# Patient Record
Sex: Male | Born: 1960 | Race: Black or African American | Hispanic: No | Marital: Married | Smoking: Never smoker
Health system: Southern US, Community
[De-identification: ages and names within clinical notes are randomized; demographics above are authoritative.]

## PROBLEM LIST (undated history)

## (undated) DIAGNOSIS — D509 Iron deficiency anemia, unspecified: Principal | ICD-10-CM

## (undated) DIAGNOSIS — C259 Malignant neoplasm of pancreas, unspecified: Secondary | ICD-10-CM

## (undated) DIAGNOSIS — C229 Malignant neoplasm of liver, not specified as primary or secondary: Secondary | ICD-10-CM

## (undated) HISTORY — DX: Iron deficiency anemia, unspecified: D50.9

## (undated) HISTORY — PX: COLONOSCOPY: SHX174

## (undated) HISTORY — PX: UPPER GI ENDOSCOPY: SHX6162

---
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
## 1982-08-31 HISTORY — PX: ABDOMINAL ADHESION SURGERY: SHX90

## 2003-05-15 ENCOUNTER — Encounter: Payer: Self-pay | Admitting: Emergency Medicine

## 2003-05-15 ENCOUNTER — Emergency Department (HOSPITAL_COMMUNITY): Admission: EM | Admit: 2003-05-15 | Discharge: 2003-05-15 | Payer: Self-pay | Admitting: Emergency Medicine

## 2004-10-22 ENCOUNTER — Ambulatory Visit: Payer: Self-pay | Admitting: Hematology & Oncology

## 2004-12-18 ENCOUNTER — Ambulatory Visit: Payer: Self-pay | Admitting: Hematology & Oncology

## 2005-04-09 ENCOUNTER — Ambulatory Visit: Payer: Self-pay | Admitting: Hematology & Oncology

## 2005-05-25 ENCOUNTER — Ambulatory Visit: Payer: Self-pay | Admitting: Hematology & Oncology

## 2005-06-04 ENCOUNTER — Ambulatory Visit: Payer: Self-pay | Admitting: Hematology & Oncology

## 2005-12-02 ENCOUNTER — Ambulatory Visit: Payer: Self-pay | Admitting: Hematology & Oncology

## 2005-12-30 LAB — CBC & DIFF AND RETIC
Basophils Absolute: 0 10*3/uL (ref 0.0–0.1)
Eosinophils Absolute: 0.2 10*3/uL (ref 0.0–0.5)
HCT: 39.8 % (ref 38.7–49.9)
HGB: 13.4 g/dL (ref 13.0–17.1)
LYMPH%: 54.2 % — ABNORMAL HIGH (ref 14.0–48.0)
MCV: 80.8 fL — ABNORMAL LOW (ref 81.6–98.0)
MONO%: 7.5 % (ref 0.0–13.0)
NEUT#: 1.6 10*3/uL (ref 1.5–6.5)
Platelets: 222 10*3/uL (ref 145–400)
RDW: 14 % (ref 11.2–14.6)
RETIC #: 32 10*3/uL (ref 31.8–103.9)

## 2005-12-30 LAB — FERRITIN: Ferritin: 5 ng/mL — ABNORMAL LOW (ref 22–322)

## 2006-06-28 ENCOUNTER — Ambulatory Visit: Payer: Self-pay | Admitting: Hematology & Oncology

## 2006-06-30 LAB — CBC WITH DIFFERENTIAL/PLATELET
Basophils Absolute: 0 10*3/uL (ref 0.0–0.1)
Eosinophils Absolute: 0.1 10*3/uL (ref 0.0–0.5)
HCT: 42.9 % (ref 38.7–49.9)
LYMPH%: 38 % (ref 14.0–48.0)
MONO#: 0.3 10*3/uL (ref 0.1–0.9)
NEUT#: 2.9 10*3/uL (ref 1.5–6.5)
NEUT%: 53.4 % (ref 40.0–75.0)
Platelets: 247 10*3/uL (ref 145–400)
WBC: 5.4 10*3/uL (ref 4.0–10.0)

## 2007-02-08 ENCOUNTER — Ambulatory Visit: Payer: Self-pay | Admitting: Internal Medicine

## 2007-02-08 DIAGNOSIS — E164 Increased secretion of gastrin: Secondary | ICD-10-CM | POA: Insufficient documentation

## 2007-02-08 DIAGNOSIS — N401 Enlarged prostate with lower urinary tract symptoms: Secondary | ICD-10-CM | POA: Insufficient documentation

## 2007-02-09 ENCOUNTER — Encounter: Payer: Self-pay | Admitting: Internal Medicine

## 2007-02-10 LAB — CONVERTED CEMR LAB: Hgb A1c MFr Bld: 6.3 % — ABNORMAL HIGH (ref 4.6–6.0)

## 2007-06-27 ENCOUNTER — Ambulatory Visit: Payer: Self-pay | Admitting: Hematology & Oncology

## 2007-06-29 LAB — FERRITIN: Ferritin: 6 ng/mL — ABNORMAL LOW (ref 22–322)

## 2007-06-29 LAB — CBC WITH DIFFERENTIAL/PLATELET
Basophils Absolute: 0 10*3/uL (ref 0.0–0.1)
Eosinophils Absolute: 0.1 10*3/uL (ref 0.0–0.5)
HGB: 13.8 g/dL (ref 13.0–17.1)
MONO#: 0.3 10*3/uL (ref 0.1–0.9)
NEUT#: 2.1 10*3/uL (ref 1.5–6.5)
RBC: 5.08 10*6/uL (ref 4.20–5.71)
RDW: 13.4 % (ref 11.2–14.6)
WBC: 4.6 10*3/uL (ref 4.0–10.0)

## 2007-06-29 LAB — CHCC SMEAR

## 2007-07-18 ENCOUNTER — Ambulatory Visit: Payer: Self-pay | Admitting: Internal Medicine

## 2007-07-18 DIAGNOSIS — F528 Other sexual dysfunction not due to a substance or known physiological condition: Secondary | ICD-10-CM | POA: Insufficient documentation

## 2007-08-15 ENCOUNTER — Encounter: Payer: Self-pay | Admitting: Internal Medicine

## 2007-09-01 DIAGNOSIS — C259 Malignant neoplasm of pancreas, unspecified: Secondary | ICD-10-CM

## 2007-09-01 DIAGNOSIS — C229 Malignant neoplasm of liver, not specified as primary or secondary: Secondary | ICD-10-CM

## 2007-09-01 HISTORY — DX: Malignant neoplasm of pancreas, unspecified: C25.9

## 2007-09-01 HISTORY — PX: LIVER SURGERY: SHX698

## 2007-09-01 HISTORY — PX: PANCREAS SURGERY: SHX731

## 2007-09-01 HISTORY — DX: Malignant neoplasm of liver, not specified as primary or secondary: C22.9

## 2007-09-26 ENCOUNTER — Ambulatory Visit: Payer: Self-pay | Admitting: Internal Medicine

## 2007-09-26 ENCOUNTER — Encounter: Payer: Self-pay | Admitting: Emergency Medicine

## 2007-09-26 ENCOUNTER — Inpatient Hospital Stay (HOSPITAL_COMMUNITY): Admission: EM | Admit: 2007-09-26 | Discharge: 2007-09-27 | Payer: Self-pay | Admitting: Emergency Medicine

## 2007-09-29 ENCOUNTER — Ambulatory Visit: Payer: Self-pay | Admitting: Hematology & Oncology

## 2007-09-29 ENCOUNTER — Encounter: Payer: Self-pay | Admitting: Internal Medicine

## 2007-10-10 ENCOUNTER — Ambulatory Visit (HOSPITAL_COMMUNITY): Admission: RE | Admit: 2007-10-10 | Discharge: 2007-10-10 | Payer: Self-pay | Admitting: Hematology & Oncology

## 2007-10-10 LAB — CBC WITH DIFFERENTIAL/PLATELET
Basophils Absolute: 0 10*3/uL (ref 0.0–0.1)
EOS%: 1.8 % (ref 0.0–7.0)
Eosinophils Absolute: 0.1 10*3/uL (ref 0.0–0.5)
LYMPH%: 46.1 % (ref 14.0–48.0)
MCH: 23.7 pg — ABNORMAL LOW (ref 28.0–33.4)
MCV: 75.6 fL — ABNORMAL LOW (ref 81.6–98.0)
MONO%: 6.2 % (ref 0.0–13.0)
NEUT#: 2.3 10*3/uL (ref 1.5–6.5)
Platelets: 244 10*3/uL (ref 145–400)
RBC: 5.03 10*6/uL (ref 4.20–5.71)

## 2007-10-10 LAB — COMPREHENSIVE METABOLIC PANEL
AST: 26 U/L (ref 0–37)
Alkaline Phosphatase: 58 U/L (ref 39–117)
BUN: 10 mg/dL (ref 6–23)
Glucose, Bld: 96 mg/dL (ref 70–99)
Sodium: 141 mEq/L (ref 135–145)
Total Bilirubin: 0.5 mg/dL (ref 0.3–1.2)

## 2007-11-10 ENCOUNTER — Ambulatory Visit: Payer: Self-pay | Admitting: Hematology & Oncology

## 2008-06-29 ENCOUNTER — Ambulatory Visit: Payer: Self-pay | Admitting: Hematology & Oncology

## 2008-08-09 ENCOUNTER — Telehealth (INDEPENDENT_AMBULATORY_CARE_PROVIDER_SITE_OTHER): Payer: Self-pay | Admitting: *Deleted

## 2008-09-06 ENCOUNTER — Ambulatory Visit: Payer: Self-pay | Admitting: Internal Medicine

## 2008-09-06 DIAGNOSIS — D376 Neoplasm of uncertain behavior of liver, gallbladder and bile ducts: Secondary | ICD-10-CM

## 2008-09-06 DIAGNOSIS — D510 Vitamin B12 deficiency anemia due to intrinsic factor deficiency: Secondary | ICD-10-CM

## 2008-09-10 ENCOUNTER — Encounter (INDEPENDENT_AMBULATORY_CARE_PROVIDER_SITE_OTHER): Payer: Self-pay | Admitting: *Deleted

## 2009-05-08 ENCOUNTER — Ambulatory Visit: Payer: Self-pay | Admitting: Internal Medicine

## 2009-05-08 DIAGNOSIS — IMO0001 Reserved for inherently not codable concepts without codable children: Secondary | ICD-10-CM

## 2009-05-10 LAB — CONVERTED CEMR LAB
Basophils Absolute: 0 10*3/uL (ref 0.0–0.1)
Basophils Relative: 0.1 % (ref 0.0–3.0)
Eosinophils Absolute: 0.1 10*3/uL (ref 0.0–0.7)
HCT: 39.2 % (ref 39.0–52.0)
Hemoglobin: 12.7 g/dL — ABNORMAL LOW (ref 13.0–17.0)
Lymphs Abs: 1.6 10*3/uL (ref 0.7–4.0)
MCHC: 32.4 g/dL (ref 30.0–36.0)
Monocytes Relative: 7.8 % (ref 3.0–12.0)
Neutro Abs: 0.8 10*3/uL — ABNORMAL LOW (ref 1.4–7.7)
RBC: 5.32 M/uL (ref 4.22–5.81)
RDW: 17.7 % — ABNORMAL HIGH (ref 11.5–14.6)
Vitamin B-12: 313 pg/mL (ref 211–911)

## 2009-05-14 ENCOUNTER — Encounter (INDEPENDENT_AMBULATORY_CARE_PROVIDER_SITE_OTHER): Payer: Self-pay | Admitting: *Deleted

## 2009-05-14 ENCOUNTER — Telehealth (INDEPENDENT_AMBULATORY_CARE_PROVIDER_SITE_OTHER): Payer: Self-pay | Admitting: *Deleted

## 2009-06-04 ENCOUNTER — Ambulatory Visit: Payer: Self-pay | Admitting: Internal Medicine

## 2009-06-05 ENCOUNTER — Encounter (INDEPENDENT_AMBULATORY_CARE_PROVIDER_SITE_OTHER): Payer: Self-pay | Admitting: *Deleted

## 2009-06-05 LAB — CONVERTED CEMR LAB
Eosinophils Relative: 2.5 % (ref 0.0–5.0)
HCT: 40.7 % (ref 39.0–52.0)
Hemoglobin: 13.1 g/dL (ref 13.0–17.0)
Lymphs Abs: 2 10*3/uL (ref 0.7–4.0)
MCV: 75.2 fL — ABNORMAL LOW (ref 78.0–100.0)
Monocytes Absolute: 0.4 10*3/uL (ref 0.1–1.0)
Monocytes Relative: 8 % (ref 3.0–12.0)
Neutro Abs: 2 10*3/uL (ref 1.4–7.7)
Platelets: 197 10*3/uL (ref 150.0–400.0)
WBC: 4.5 10*3/uL (ref 4.5–10.5)

## 2009-07-05 ENCOUNTER — Ambulatory Visit: Payer: Self-pay | Admitting: Internal Medicine

## 2009-07-25 ENCOUNTER — Emergency Department (HOSPITAL_COMMUNITY): Admission: EM | Admit: 2009-07-25 | Discharge: 2009-07-25 | Payer: Self-pay | Admitting: Emergency Medicine

## 2009-08-01 ENCOUNTER — Ambulatory Visit: Payer: Self-pay | Admitting: Internal Medicine

## 2009-08-02 ENCOUNTER — Ambulatory Visit: Payer: Self-pay | Admitting: Internal Medicine

## 2009-08-02 DIAGNOSIS — M545 Low back pain: Secondary | ICD-10-CM

## 2009-08-05 ENCOUNTER — Encounter: Payer: Self-pay | Admitting: Internal Medicine

## 2009-09-02 ENCOUNTER — Ambulatory Visit: Payer: Self-pay | Admitting: Internal Medicine

## 2009-09-27 ENCOUNTER — Telehealth (INDEPENDENT_AMBULATORY_CARE_PROVIDER_SITE_OTHER): Payer: Self-pay | Admitting: *Deleted

## 2009-10-02 ENCOUNTER — Ambulatory Visit: Payer: Self-pay | Admitting: Internal Medicine

## 2009-10-30 ENCOUNTER — Ambulatory Visit: Payer: Self-pay | Admitting: Internal Medicine

## 2010-01-01 ENCOUNTER — Ambulatory Visit: Payer: Self-pay | Admitting: Internal Medicine

## 2010-02-24 ENCOUNTER — Encounter: Payer: Self-pay | Admitting: Internal Medicine

## 2010-09-02 ENCOUNTER — Telehealth (INDEPENDENT_AMBULATORY_CARE_PROVIDER_SITE_OTHER): Payer: Self-pay | Admitting: *Deleted

## 2010-09-15 ENCOUNTER — Telehealth (INDEPENDENT_AMBULATORY_CARE_PROVIDER_SITE_OTHER): Payer: Self-pay | Admitting: *Deleted

## 2010-09-28 LAB — CONVERTED CEMR LAB
ALT: 32 units/L (ref 0–53)
AST: 32 units/L (ref 0–37)
Alkaline Phosphatase: 63 units/L (ref 39–117)
Basophils Absolute: 0 10*3/uL (ref 0.0–0.1)
Bilirubin, Direct: 0.1 mg/dL (ref 0.0–0.3)
CO2: 30 meq/L (ref 19–32)
Chloride: 102 meq/L (ref 96–112)
Glucose, Bld: 87 mg/dL (ref 70–99)
Hemoglobin: 10.7 g/dL — ABNORMAL LOW (ref 13.0–17.0)
LDL Cholesterol: 117 mg/dL — ABNORMAL HIGH (ref 0–99)
Lymphocytes Relative: 52.3 % — ABNORMAL HIGH (ref 12.0–46.0)
Monocytes Relative: 10.7 % (ref 3.0–12.0)
Neutro Abs: 1.4 10*3/uL (ref 1.4–7.7)
Neutrophils Relative %: 33.5 % — ABNORMAL LOW (ref 43.0–77.0)
Platelets: 257 10*3/uL (ref 150–400)
Potassium: 4 meq/L (ref 3.5–5.1)
RDW: 15.6 % — ABNORMAL HIGH (ref 11.5–14.6)
Sodium: 139 meq/L (ref 135–145)
Total Bilirubin: 0.7 mg/dL (ref 0.3–1.2)
Total CHOL/HDL Ratio: 4.2
Total Protein: 7.3 g/dL (ref 6.0–8.3)
Triglycerides: 34 mg/dL (ref 0–149)
VLDL: 7 mg/dL (ref 0–40)

## 2010-09-30 NOTE — Assessment & Plan Note (Signed)
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)    Route: IM    Site: R deltoid    Exp Date: 07/2011    Lot #: [id number]    Mfr: American Regent    Patient tolerated injection without complications    Given by: Floydene Flock (October 30, 2009 1:27 PM)  Orders Added: 1)  Admin of Therapeutic Inj  intramuscular or subcutaneous [96372] 2)  Vit B12 1000 mcg [J3420]   Medication Administration  Injection # 1:    Medication: Vit B12 1000 mcg    Diagnosis: PERNICIOUS ANEMIA (ICD-281.0)    Route: IM    Site: R deltoid    Exp Date: 07/2011    Lot #: [id number]    Mfr: American Regent    Patient tolerated injection without complications    Given by: Floydene Flock (October 30, 2009 1:27 PM)  Orders Added: 1)  Admin of Therapeutic Inj  intramuscular or subcutaneous [96372] 2)  Vit B12 1000 mcg [J3420]

## 2010-09-30 NOTE — Assessment & Plan Note (Signed)
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.0)    Route: IM    Site: R deltoid    Exp Date: 06/2011    Lot #: [id number]    Mfr: American Regent    Patient tolerated injection without complications    Given by: Floydene Flock (October 02, 2009 1:11 PM)  Orders Added: 1)  Admin of Therapeutic Inj  intramuscular or subcutaneous [96372] 2)  Vit B12 1000 mcg [J3420]   Medication Administration  Injection # 1:    Medication: Vit B12 1000 mcg    Diagnosis: PERNICIOUS ANEMIA (ICD-281.0)    Route: IM    Site: R deltoid    Exp Date: 06/2011    Lot #: [id number]    Mfr: American Regent    Patient tolerated injection without complications    Given by: Floydene Flock (October 02, 2009 1:11 PM)  Orders Added: 1)  Admin of Therapeutic Inj  intramuscular or subcutaneous [96372] 2)  Vit B12 1000 mcg [J3420]

## 2010-09-30 NOTE — Consult Note (Signed)
Summary: [facility]  [facility]   Imported By: Lanelle Bal 03/12/2010 10:24:37  _____________________________________________________________________  External Attachment:    Type:   Image     Comment:   External Document

## 2010-09-30 NOTE — Progress Notes (Signed)
Summary: REFILL  Phone Note Refill Request Message from:  Fax from Pharmacy on [address] FAX [phone]  Refills Requested: Medication #1:  CIALIS 20 MG  TABS 1 q 3 days as needed Initial call taken by: Barb Merino,  September 27, 2009 2:35 PM    Prescriptions: CIALIS 20 MG  TABS (TADALAFIL) 1 q 3 days as needed  #6 x 5   Entered by:   Shonna Chock   Authorized by:   Marga Melnick MD   Signed by:   Shonna Chock on 09/27/2009   Method used:   Electronically to        [facility] [address] [phone]* (retail)       [address]       [place], [place]  [address]       Ph: [phone]       Fax: [phone]   RxID:   [id number]

## 2010-09-30 NOTE — Assessment & Plan Note (Signed)
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)    Route: IM    Site: R deltoid    Exp Date: 05/2011    Lot #: [id number]    Mfr: American Regent    Patient tolerated injection without complications    Given by: Floydene Flock (September 02, 2009 2:18 PM)  Orders Added: 1)  Admin of Therapeutic Inj  intramuscular or subcutaneous [96372] 2)  Vit B12 1000 mcg [J3420]   Medication Administration  Injection # 1:    Medication: Vit B12 1000 mcg    Diagnosis: PERNICIOUS ANEMIA (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

## 2010-10-02 NOTE — Progress Notes (Signed)
Summary: RX  Phone Note Call from Patient Call back at [phone]   Caller: Patient Summary of Call: PT IS CALLING FOR REFILL ON CIALIS 20 MG TO BE SEND TO [facility] ON [address]. Initial call taken by: Freddy Jaksch,  September 15, 2010 11:38 AM    Prescriptions: CIALIS 20 MG  TABS (TADALAFIL) 1 q 3 days as needed  #6 x 0   Entered by:   Shonna Chock CMA   Authorized by:   Marga Melnick MD   Signed by:   Shonna Chock CMA on 09/15/2010   Method used:   Electronically to        [facility] [phone]* (retail)       [address]       [place], [place]  [address]       Ph: [phone]       Fax: [phone]   RxID:   [id number]

## 2010-10-02 NOTE — Progress Notes (Signed)
Summary: RX  Phone Note Call from Patient Call back at Home Phone [phone]   Caller: Patient Call For: Marga Melnick MD Summary of Call: PT WAS A WALK IN AND WANTED SAMPLE OF CILIAS 20 MG NOW HE WOULD A SCRIPT FOR CILIAS 20 MG TO GO TO THE PHARM. Initial call taken by: Freddy Jaksch,  September 02, 2010 1:59 PM  Follow-up for Phone Call        1.) Patient not see x 1 year, please contact patient for an appointment (CPX-30 min appointment)   2.) Please get a pharmacy number for RX to be sent, no refills will be given due to last OV longer than 1 year Follow-up by: Shonna Chock CMA,  September 02, 2010 3:11 PM  Additional Follow-up for Phone Call Additional follow up Details #1::        left message on 1-3-1-4-1-5, HE  has not return a call. Additional Follow-up by: Freddy Jaksch,  September 04, 2010 10:10 AM    Additional Follow-up for Phone Call Additional follow up Details #2::    PT NEVER RETURN CALL FOR THIS SCRIPT NOR WITH A PHARMACY NAME Follow-up by: Freddy Jaksch,  September 05, 2010 11:54 AM

## 2011-01-09 ENCOUNTER — Telehealth: Payer: Self-pay | Admitting: *Deleted

## 2011-01-12 NOTE — Telephone Encounter (Signed)
Opened in error

## 2011-01-13 NOTE — H&P (Signed)
NAME:  Adam Huffman, Adam Huffman                ACCOUNT NO.:  [id number]   MEDICAL RECORD NO.:  [id number]          PATIENT TYPE:  EMS   LOCATION:  ED                           FACILITY:  [facility]   PHYSICIAN:  Sabino Donovan, MD        DATE OF BIRTH:  19-Apr-1961   DATE OF ADMISSION:  09/25/2007  DATE OF DISCHARGE:                              HISTORY & PHYSICAL   PRIMARY CARE PHYSICIAN:  Dr. [name].   CHIEF COMPLAINT:  Abdominal pain.   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  Reports that  after church, he ate raw broccoli and trail mix containing nuts.  Around  8:00 o'clock the night of presentation, he started having mid  epigastric, sharp, abdominal pain.  He also noticed some abdominal  distention as well and noted that he had a difficult time passing gas.  Actually he was not passing gas at all.  It kind of reminded him of his  previous abdominal pain related to his abdominal obstruction and he  presented to the ER.  Otherwise, he denied any fever or chills, nausea  or vomiting, diarrhea.  He reports that he had two bowel movements the  day of presentation but has not been passing gas or having bowel  movement after the pain started.   ALLERGIES:  NO KNOWN DRUG ALLERGIES.   MEDICATION:  Vitamin B12 shots every now and then.   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  Reportedly, it was metastasized to his liver as well and reports  initially it was thought to be malignant but then he has been doing well  for the last 20 years.   PAST SURGICAL HISTORY:  1. Total gastrectomy in 1981 in [place], [place], secondary      to ulcers.  2. He had another abdominal surgery  secondary to adhesions in 1984.      Of note, he has been followed with Dr. Myna Hidalgo, an oncologist at      [facility] for his cancer, although no further      work-up has been done since 1985.   FAMILY HISTORY:  Noncontributory.   SOCIAL HISTORY:  Negative x3.   REVIEW OF SYSTEMS:  Negative.   PHYSICAL EXAMINATION:  VITAL SIGNS:  Temperature 98.7, pulse 75,  respiratory rate 18, blood pressure 128/94.  GENERAL APPEARANCE:  He is in no acute distress.  HEENT:  PERRLA, EOMI.  NECK:  No lymphadenopathy.  No thyromegaly.  No JVD.  CHEST:  Clear to auscultation bilaterally.  CARDIOVASCULAR:  Regular rate and rhythm.  No murmurs, rubs, or gallops.  ABDOMEN:  Soft, nontender, nondistended, normal active bowel sounds.  EXTREMITIES:  No clubbing, cyanosis, or edema.   LABORATORY DATA:  Sodium 137, potassium 4.1, BUN 9, creatinine 1.04,  calcium  9.5.  White count 10.8, hemoglobin 13, hematocrit 39.3,  platelets  279.  AST 31, ALT 23, albumin 4.7, total protein 7.9, lipase  12, amylase 112.  UA was negative.   A chest x-ray showed small-bowel ileus/partial obstruction, mild colonic  ileus.   CT of his abdomen showed a 10 x 7 x 9 cm hydrogenous mass in the  inferior right lobe of the liver with calcification along the medial  border, leading diagnosis hepatocellular carcinoma, adenomas rare and  min, no other lesion to suggest mets.  Patient is status post subtotal  gastrectomy, intermediate cystic lesion in kidney.  He had small-bowel  ileus with mild dilation and no obstruction was noted.  Minimal  perihepatic free fluid.   IMPRESSION:  A 50 year old African American male with history of total  gastrectomy, status post another abdominal surgery secondary to adhesion  and history of questionable pancreatic cancer with questionable  metastases to the liver, who presented to the ED with complaints of  abdominal pain after eating broccoli and nuts.  1. Abdominal pain:   Given the clinical history of colicky abdominal      pain along with chest x-ray and CT finding of ileus, it is likely      pain related to his ileus.  Patient now reports that after      presenting to the ER, he has passed gas and now feels back to his      baseline.  His abdominal exam is certainly very benign.  I will      give IV fluids and follow.  We will obtain another KUB in a few      hours.  If KUB shows no ileus, he could potentially be discharged      home.  2. Liver mass:  Patient is noted to have this 10 x 9 x 6 cm liver mass      in his right lobe.  Apparently has a history which dates back to      1985 and he has been followed by Dr. Myna Hidalgo, oncologist, and alpha      fetoprotein given the leading diagnosis of hepatocellular      carcinoma.  He denies any history of heavy alcohol use or any      intravenous drug use.  This can be followed up as an outpatient.      Sabino Donovan, MD  Electronically Signed     MJ/MEDQ  D:  09/26/2007  T:  09/26/2007  Job:  [id number]

## 2011-01-13 NOTE — Discharge Summary (Signed)
NAME:  Adam Huffman, Adam Huffman                ACCOUNT NO.:  [id number]   MEDICAL RECORD NO.:  [id number]          PATIENT TYPE:  EMS   LOCATION:  ED                           FACILITY:  [facility]   PHYSICIAN:  Rosalyn Gess. Norins, MD  DATE OF BIRTH:  1961-04-16   DATE OF ADMISSION:  09/26/2007  DATE OF DISCHARGE:  09/27/2007                               DISCHARGE SUMMARY   ADMISSION DIAGNOSIS:  Nausea and vomiting, abdominal discomfort   DISCHARGE DIAGNOSES:  1. Abdominal pain from ileus, resolved.  2. Gastrinoma of the liver.   HISTORY OF PRESENT ILLNESS:  The patient is a 50 year old African  American gentleman with a prior history of Zollinger-Ellison syndrome  with a gastrinoma who underwent gastrectomy in 1981.  He presented to  [facility] emergency department complaining of colicky abdominal pain.  This began in the early evening hours and continued to be a problem.  He  had had some raw vegetables to which he lays the blame.  The patient  also had some abdominal distention.  Because of his abdominal  discomfort, he was admitted to the hospital.   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  The patient at  that time was found to have a 5-cm tumor in the inferior aspect of the  right lobe of the liver.  Records from [facility] were reviewed which  revealed the patient to have had a gastrinoma by tissue diagnosis.  Decision at that time was made to not resect.  He abdominal surgery  secondary to adhesions in 1987, with biopsy as noted.   PHYSICAL EXAMINATION:  VITAL SIGNS:  Afebrile, blood pressure was  stable.  ABDOMEN:  Soft, nontender, with no distention.  Normal bowel sounds were  noted.   LABORATORY DATA:  Data base revealed normal chemistries.  Hemoglobin was  13 grams, white count 10,800.  Liver functions were normal.  Lipase was  normal, amylase was normal.  UA was negative.   Chest x-ray/abdominal  films showed the patient had what appeared to be a  small bowel ileus and mid-colonic ileus.  CT of the abdomen revealed a  10 x 7 x 9-cm heterogeneous mass in the inferior right lobe of the  liver.   HOSPITAL COURSE:  The patient was put to bowel rest.  Over 24 hours, he  improved to the point where he had normal bowel sounds.  The abdomen was  soft and nontender.  He was able take a clear liquid diet.   Discussed the CT findings with the patient at length.  Also consulted  Dr. Arlan Organ by phone.  At this time, with the patient having prior  tissue diagnosis of a mass in the exact location, repeat biopsy is not  indicated.  The patient will be a candidate for outpatient followup with  Dr. Myna Hidalgo who will help the patient plan further evaluation and  treatment as indicated.   DISCHARGE PHYSICAL EXAMINATION:  VITAL SIGNS:  Temperature 97.3, blood  pressure 126/87, heart rate 62, respirations were 20, O2 saturation 99%.  ABDOMEN:  The patient had positive bowel sounds.  There was no guarding  or rebound.  Did not feel any organosplenomegaly, including no  hepatomegaly.   Final laboratory from September 26, 2007, hemoglobin 11.2 grams, white  count was 8000, platelet count 234,000, MCV 76.  Normal differential was  noted.  Chemistries with sodium 134, potassium 3.8, chloride 103, CO2  25, BUN 6, creatinine 0.9, glucose was 98.  Liver functions were normal.  Amylase was as noted.  Lipase was as noted.  Urinalysis was negative.  Alpha-fetoprotein was 6.1, normal range being 0-8.0.   DISPOSITION:  The patient is discharged home.  No medications were added  to his regimen.  [facility] with  Dr. Arlan Organ for followup.   CONDITION ON DISCHARGE:  Improved.      Rosalyn Gess Norins, MD  Electronically Signed     MEN/MEDQ  D:  09/27/2007  T:  09/27/2007  Job:  [id number]   cc:   Titus Dubin. [name], MD,FACP,FCCP  [phone] [place]  [place] [address]   [place]. Myna Hidalgo, M.D.  Fax: [phone]

## 2011-04-21 ENCOUNTER — Emergency Department (INDEPENDENT_AMBULATORY_CARE_PROVIDER_SITE_OTHER): Payer: No Typology Code available for payment source

## 2011-04-21 ENCOUNTER — Emergency Department (HOSPITAL_BASED_OUTPATIENT_CLINIC_OR_DEPARTMENT_OTHER)
Admission: EM | Admit: 2011-04-21 | Discharge: 2011-04-22 | Disposition: A | Discharge status: Home / Self Care | Payer: No Typology Code available for payment source | Attending: Emergency Medicine | Admitting: Emergency Medicine

## 2011-04-21 ENCOUNTER — Encounter: Payer: Self-pay | Admitting: *Deleted

## 2011-04-21 DIAGNOSIS — Z9889 Other specified postprocedural states: Secondary | ICD-10-CM

## 2011-04-21 DIAGNOSIS — M542 Cervicalgia: Secondary | ICD-10-CM | POA: Insufficient documentation

## 2011-04-21 DIAGNOSIS — Y9289 Other specified places as the place of occurrence of the external cause: Secondary | ICD-10-CM | POA: Insufficient documentation

## 2011-04-21 DIAGNOSIS — M545 Low back pain, unspecified: Secondary | ICD-10-CM | POA: Insufficient documentation

## 2011-04-21 DIAGNOSIS — R51 Headache: Secondary | ICD-10-CM | POA: Insufficient documentation

## 2011-04-21 DIAGNOSIS — R079 Chest pain, unspecified: Secondary | ICD-10-CM

## 2011-04-21 DIAGNOSIS — S0990XA Unspecified injury of head, initial encounter: Secondary | ICD-10-CM | POA: Insufficient documentation

## 2011-04-21 HISTORY — DX: Malignant neoplasm of pancreas, unspecified: C25.9

## 2011-04-21 HISTORY — DX: Malignant neoplasm of liver, not specified as primary or secondary: C22.9

## 2011-04-21 LAB — DIFFERENTIAL
Basophils Absolute: 0.1 10*3/uL (ref 0.0–0.1)
Eosinophils Absolute: 0.2 10*3/uL (ref 0.0–0.7)
Lymphs Abs: 2.9 10*3/uL (ref 0.7–4.0)
Monocytes Absolute: 0.5 10*3/uL (ref 0.1–1.0)
Neutro Abs: 2.8 10*3/uL (ref 1.7–7.7)

## 2011-04-21 LAB — CBC
HCT: 33.9 % — ABNORMAL LOW (ref 39.0–52.0)
MCH: 22.5 pg — ABNORMAL LOW (ref 26.0–34.0)
MCHC: 32.4 g/dL (ref 30.0–36.0)
MCV: 69.3 fL — ABNORMAL LOW (ref 78.0–100.0)
Platelets: 229 10*3/uL (ref 150–400)
RDW: 16.3 % — ABNORMAL HIGH (ref 11.5–15.5)
WBC: 6.5 10*3/uL (ref 4.0–10.5)

## 2011-04-21 MED ORDER — ONDANSETRON HCL 4 MG/2ML IJ SOLN
4.0000 mg | Freq: Once | INTRAMUSCULAR | Status: AC
  Administered 2011-04-21: 4 mg via INTRAVENOUS
  Filled 2011-04-21: qty 2

## 2011-04-21 MED ORDER — MORPHINE SULFATE 4 MG/ML IJ SOLN
4.0000 mg | Freq: Once | INTRAMUSCULAR | Status: AC
  Administered 2011-04-21: 4 mg via INTRAVENOUS
  Filled 2011-04-21: qty 1

## 2011-04-21 MED ORDER — IOHEXOL 300 MG/ML  SOLN
100.0000 mL | Freq: Once | INTRAMUSCULAR | Status: AC | PRN
  Administered 2011-04-21: 100 mL via INTRAVENOUS

## 2011-04-21 MED ORDER — SODIUM CHLORIDE 0.9 % IV SOLN
INTRAVENOUS | Status: DC
  Administered 2011-04-21: 23:00:00 via INTRAVENOUS
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
## 2011-04-22 LAB — URINALYSIS, ROUTINE W REFLEX MICROSCOPIC
Bilirubin Urine: NEGATIVE
Hgb urine dipstick: NEGATIVE
Ketones, ur: NEGATIVE mg/dL
Nitrite: NEGATIVE
Protein, ur: NEGATIVE mg/dL
Specific Gravity, Urine: >1.046 — ABNORMAL HIGH (ref 1.005–1.030)
Urobilinogen, UA: 0.2 mg/dL (ref 0.0–1.0)

## 2011-04-22 MED ORDER — HYDROCODONE-ACETAMINOPHEN 5-500 MG PO TABS: 1.0000 | ORAL_TABLET | Freq: Four times a day (QID) | ORAL | Status: AC | PRN

## 2011-05-22 LAB — DIFFERENTIAL
Eosinophils Absolute: 0
Eosinophils Absolute: 0.1
Eosinophils Relative: 1
Eosinophils Relative: 1
Lymphocytes Relative: 16
Lymphocytes Relative: 19
Lymphs Abs: 1.5
Lymphs Abs: 1.7
Monocytes Absolute: 0.4
Monocytes Absolute: 0.5
Monocytes Relative: 5

## 2011-05-22 LAB — CBC
HCT: 34 — ABNORMAL LOW
Hemoglobin: 11.2 — ABNORMAL LOW
MCHC: 33.1
MCV: 76 — ABNORMAL LOW
MCV: 76.1 — ABNORMAL LOW
Platelets: 234
Platelets: 279
RBC: 5.16
RDW: 14.5
RDW: 14.5

## 2011-05-22 LAB — URINALYSIS, ROUTINE W REFLEX MICROSCOPIC
Nitrite: NEGATIVE
Specific Gravity, Urine: 1.021
Urobilinogen, UA: 0.2

## 2011-05-22 LAB — COMPREHENSIVE METABOLIC PANEL
ALT: 23
AST: 31
Albumin: 4.7
CO2: 27
Calcium: 9.5
Creatinine, Ser: 1.04
GFR calc Af Amer: >60
Sodium: 137
Total Protein: 7.9

## 2011-05-22 LAB — BASIC METABOLIC PANEL
BUN: 6
Chloride: 103
GFR calc non Af Amer: >60
Glucose, Bld: 98
Potassium: 3.8
Sodium: 134 — ABNORMAL LOW

## 2011-05-22 LAB — LIPASE, BLOOD: Lipase: 21

## 2011-05-22 LAB — AMYLASE: Amylase: 112

## 2012-06-03 ENCOUNTER — Encounter: Payer: Self-pay | Admitting: Internal Medicine

## 2012-06-03 ENCOUNTER — Ambulatory Visit (INDEPENDENT_AMBULATORY_CARE_PROVIDER_SITE_OTHER): Payer: Self-pay | Admitting: Internal Medicine

## 2012-06-03 VITALS — BP 116/80 | HR 70 | Temp 98.6°F | Wt 170.0 lb

## 2012-06-03 DIAGNOSIS — D510 Vitamin B12 deficiency anemia due to intrinsic factor deficiency: Secondary | ICD-10-CM

## 2012-06-03 DIAGNOSIS — E611 Iron deficiency: Secondary | ICD-10-CM

## 2012-06-03 DIAGNOSIS — D509 Iron deficiency anemia, unspecified: Secondary | ICD-10-CM

## 2012-06-03 NOTE — Patient Instructions (Addendum)
Please complete & return stool cards.  If you activate My Chart; the results can be released to you as soon as they populate from the lab. If you choose not to use this program; the labs have to be reviewed, copied & mailed   causing a delay in getting the results to you.  

## 2012-06-03 NOTE — Addendum Note (Signed)
Addended by: Silvio Pate D on: 06/03/2012 03:36 PM   Modules accepted: Orders

## 2012-06-03 NOTE — Progress Notes (Signed)
  Subjective:    Patient ID: Adam Huffman, male    DOB: 04/11/1961, 51 y.o.   MRN: [id number]  HPI He was unable to donate blood 3 weeks ago at the [facility] because of profound iron deficiency. The testing was done as a finger prick.  Significant past history includes gastrectomy for Zollinger- Ellison syndrome. This was complicated by pernicious anemia; he is not taking B12 shots for for at least 2 years.   Past medical history/family history/social history were all reviewed and updated.     Review of Systems He denies any bleeding dyscrasias such as epistaxis, hemoptysis, melena, rectal bleeding, or hematuria. He has no abnormal bruising or bleeding.     Objective:   Physical Exam General appearance : thin but in good health and nourishment w/o distress.  Eyes: No conjunctival inflammation or scleral icterus is present.  Oral exam: Dental hygiene is good; lips and gums are healthy appearing.There is no oropharyngeal erythema or exudate noted.   Heart:  Normal rate and regular rhythm. S1 and S2 normal without gallop, murmur, click, rub . S 4 w/o extra sounds     Lungs:Chest clear to auscultation; no wheezes, rhonchi,rales ,or rubs present.No increased work of breathing.   Abdomen: bowel sounds normal, soft and non-tender without masses, organomegaly or hernias noted.  No guarding or rebound . Mid line op scar  Skin:Warm & dry.  Intact without suspicious lesions or rashes   Lymphatic: No lymphadenopathy is noted about the head, neck, axilla areas.              Assessment & Plan:  #1 iron deficiency anemia suggested by [employer] findings; past history of pernicious anemia without replacement in past 2 years  Plan: See orders and recommendations

## 2012-06-04 LAB — CBC WITH DIFFERENTIAL/PLATELET
Basophils Absolute: 0.1 10*3/uL (ref 0.0–0.1)
Basophils Relative: 2 % — ABNORMAL HIGH (ref 0–1)
Eosinophils Absolute: 0.1 10*3/uL (ref 0.0–0.7)
Eosinophils Relative: 2 % (ref 0–5)
HCT: 30.2 % — ABNORMAL LOW (ref 39.0–52.0)
MCHC: 29.5 g/dL — ABNORMAL LOW (ref 30.0–36.0)
MCV: 63.3 fL — ABNORMAL LOW (ref 78.0–100.0)
Monocytes Absolute: 0.3 10*3/uL (ref 0.1–1.0)
Platelets: 238 10*3/uL (ref 150–400)
RDW: 18.4 % — ABNORMAL HIGH (ref 11.5–15.5)

## 2012-06-06 ENCOUNTER — Telehealth: Payer: Self-pay

## 2012-06-06 ENCOUNTER — Other Ambulatory Visit: Payer: Self-pay | Admitting: Internal Medicine

## 2012-06-06 DIAGNOSIS — D649 Anemia, unspecified: Secondary | ICD-10-CM

## 2012-06-06 DIAGNOSIS — D72819 Decreased white blood cell count, unspecified: Secondary | ICD-10-CM

## 2012-06-06 DIAGNOSIS — D72820 Lymphocytosis (symptomatic): Secondary | ICD-10-CM

## 2012-06-06 NOTE — Telephone Encounter (Signed)
Patient aware of results and verbalized understanding of results

## 2012-06-06 NOTE — Telephone Encounter (Signed)
Left message on voicemail for patient to return call when available   Notes Recorded by Pecola Lawless, MD on 06/06/2012 at 7:47 AM Dramatic anemia is present; iron and B12 levels are low normal. White blood count is also low and there is increase in lymphocytes which are usually increased with viral infections. This is actually been a pattern present before. The neutrophils which fight bacterial infection are low. This pattern has also been seen before. Hematology evaluation is indicated and that will be scheduled as soon as possible. I would recommend hemoglobin electrophoresis to help assess these blood count changes. If you schedule that one morning at 8 and come in fasting; testosterone screening can be performed also. PLEASE BRING THESE INSTRUCTIONS TO FOLLOW UP LAB APPOINTMENT.This will guarantee correct labs are drawn, eliminating need for repeat blood sampling ( needle sticks ! ). Diagnoses /Codes: 285.9, ED

## 2012-06-08 ENCOUNTER — Telehealth: Payer: Self-pay | Admitting: Hematology & Oncology

## 2012-06-08 NOTE — Telephone Encounter (Signed)
Left pt message to call for appointment °

## 2012-06-10 ENCOUNTER — Other Ambulatory Visit (INDEPENDENT_AMBULATORY_CARE_PROVIDER_SITE_OTHER): Payer: Self-pay

## 2012-06-10 DIAGNOSIS — D649 Anemia, unspecified: Secondary | ICD-10-CM

## 2012-06-10 LAB — TESTOSTERONE: Testosterone: 701.64 ng/dL (ref 350.00–890.00)

## 2012-06-14 LAB — HEMOGLOBINOPATHY EVALUATION
Hemoglobin Other: 0 %
Hgb A2 Quant: 2.1 % — ABNORMAL LOW (ref 2.2–3.2)
Hgb A: 97.9 % — ABNORMAL HIGH (ref 96.8–97.8)
Hgb F Quant: 0 % (ref 0.0–2.0)
Hgb S Quant: 0 %

## 2012-06-21 ENCOUNTER — Other Ambulatory Visit (INDEPENDENT_AMBULATORY_CARE_PROVIDER_SITE_OTHER): Payer: Self-pay

## 2012-06-21 DIAGNOSIS — Z1289 Encounter for screening for malignant neoplasm of other sites: Secondary | ICD-10-CM

## 2012-06-29 ENCOUNTER — Telehealth: Payer: Self-pay | Admitting: Hematology & Oncology

## 2012-06-29 NOTE — Telephone Encounter (Signed)
Spoke to patient and confirmed appointment for 06/30/12 at 3:15pm.  Asked patient to bring all medications to appointment.

## 2012-06-30 ENCOUNTER — Ambulatory Visit: Payer: 59

## 2012-06-30 ENCOUNTER — Encounter: Payer: Self-pay | Admitting: Hematology & Oncology

## 2012-06-30 ENCOUNTER — Ambulatory Visit (HOSPITAL_BASED_OUTPATIENT_CLINIC_OR_DEPARTMENT_OTHER): Payer: 59 | Admitting: Lab

## 2012-06-30 ENCOUNTER — Ambulatory Visit (HOSPITAL_BASED_OUTPATIENT_CLINIC_OR_DEPARTMENT_OTHER): Payer: 59 | Admitting: Hematology & Oncology

## 2012-06-30 DIAGNOSIS — D510 Vitamin B12 deficiency anemia due to intrinsic factor deficiency: Secondary | ICD-10-CM

## 2012-06-30 DIAGNOSIS — D509 Iron deficiency anemia, unspecified: Secondary | ICD-10-CM

## 2012-06-30 DIAGNOSIS — E164 Increased secretion of gastrin: Secondary | ICD-10-CM

## 2012-06-30 HISTORY — DX: Iron deficiency anemia, unspecified: D50.9

## 2012-06-30 LAB — CBC WITH DIFFERENTIAL (CANCER CENTER ONLY)
EOS%: 2.4 % (ref 0.0–7.0)
Eosinophils Absolute: 0.1 10*3/uL (ref 0.0–0.5)
LYMPH%: 44.4 % (ref 14.0–48.0)
MCH: 21.3 pg — ABNORMAL LOW (ref 28.0–33.4)
MCHC: 31 g/dL — ABNORMAL LOW (ref 32.0–35.9)
MCV: 69 fL — ABNORMAL LOW (ref 82–98)
MONO%: 7.6 % (ref 0.0–13.0)
Platelets: 318 10*3/uL (ref 145–400)
RBC: 5.59 10*6/uL (ref 4.20–5.70)
RDW: 24.7 % — ABNORMAL HIGH (ref 11.1–15.7)

## 2012-06-30 LAB — CHCC SATELLITE - SMEAR

## 2012-06-30 LAB — TECHNOLOGIST REVIEW CHCC SATELLITE

## 2012-06-30 NOTE — Progress Notes (Signed)
This office note has been dictated.

## 2012-07-01 NOTE — Progress Notes (Signed)
CC:   Adam Huffman. Adam Ren, MD,FACP,FCCP  DIAGNOSES: 1. Recurrent iron deficiency anemia, likely secondary to     gastrectomy/blood donations. 2. 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 3. Earna Coder syndrome.  HISTORY OF PRESENT ILLNESS:  Adam Huffman is a real nice 51 year old black gentleman.  He has a history of Zollinger Ellison syndrome.  He had history of a gastrectomy.  This was probably back in the '80s.  He has been followed.  He then underwent a partial hepatectomy at [facility] I think back in March of 2009.  He had a bisegmentectomy.  At the time he also had a cholecystectomy.  The pathology report showed metastatic gastrinoma.  All margins were negative.  He was found to be somewhat anemic a month or so ago.  He does have history of pernicious anemia.  He had been getting B12 shots but "slacked up on these" for a while.  He saw Dr. Alwyn Huffman on August 4.  He had a CBC which showed a white cell count of 3.8, hemoglobin 8.9, hematocrit 30.2, platelet count 238.  His MCV is 63.  Iron studies done showed iron saturation of 10% with a TIBC of 547.  His B12 level was 333.  He cannot take oral iron as he does not absorb oral iron.  He has had no "cravings".  He has not been chewing ice.  He has had no burning in the hands or feet.  He has had no tingling in the hands or feet.  He has had no swallowing difficulties.  There has been no headache.  He is working a new job for the city of [employer].  He was kindly referred to the [facility] so that we could help manage his anemia.  Thankfully the Earna Coder syndrome has not been a problem for him.  I think his disease has been pretty much resected and there has been no evidence of recurrence.  He has had no weight loss.  He has had no issues with ulcers.  There has been no diarrhea.  PAST MEDICAL HISTORY:  As stated in the history of present illness.  He does have: 1. Earna Coder  syndrome. 2. 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 3. Iron deficiency anemia secondary to gastrectomy.  ALLERGIES:  None.  CURRENT MEDICATIONS:  I think just multivitamins.  SOCIAL HISTORY:  Negative for tobacco or alcohol use.  Again, he has no obvious occupational exposures.  FAMILY HISTORY:  Noncontributory.  REVIEW OF SYSTEMS:  As stated in the history of present illness.  PHYSICAL EXAMINATION:  General:  This is a well-developed, well- nourished black gentleman in no obvious distress.  Vital signs:  97.4, pulse 51, respiratory rate 18, blood pressure 128/79.  Weight is 167. Head and neck:  Shows a normocephalic, atraumatic skull.  There are no ocular or oral lesions.  There are no palpable cervical or supraclavicular lymph nodes.  Lungs:  Clear bilaterally.  Cardiac: Regular rate and rhythm with a normal S1, S2.  There are no murmurs, rubs or bruits.  Abdomen:  Shows a well-healed laparotomy scar.  He has a little bit of a keloid with the laparotomy site.  There is no fluid wave.  There is no guarding or rebound tenderness.  There is no palpable hepatosplenomegaly.  Back:  No tenderness over the spine, ribs or hips. Extremities:  Shows no clubbing, cyanosis or edema.  He has good range motion of his joints.  Neurological:  Shows no focal neurological deficit.  LABORATORY STUDIES:  White cell count 4.6, hemoglobin 11.9, hematocrit 38.4, platelet count is 318.  MCV is 69.  Peripheral smear shows moderate anisocytosis and poikilocytosis.  He has some polychromasia. He has a few couple target cells.  He has no nucleated red blood cells. I see no rouleaux formation.  There are no schistocytes.  There may be a rare spherocyte.  White cells appear normal in morphology and maturation.  He has a rare hypersegmented poly.  He has no immature myeloid cells.  There are no atypical lymphocytes.  Platelets are adequate in number and size.  He has well granulated  platelets.  IMPRESSION:  Adam Huffman is a very nice 51 year old African American gentleman with a history of Zollinger Ellison syndrome.  He has undergone gastrectomy.  He has undergone partial hepatectomy.  He clearly will be lifelong pernicious anemia.  He just will not be able to absorb B12.  As such, we need to get him back on B12 injections.  We will go ahead and get this set up for next week.  He is iron deficient.  We will need to go ahead and give him IV iron. This probably will need to be an ongoing process, maybe a couple times a year.  He has been donating blood.  This is a big source for the iron deficiency.  I told him not to donate blood until further notice.  We will go ahead and get him set up with iron infusion next week.  I will plan to see him back myself in 2 months.  By then, we should see a nice increase in his hemoglobin but more importantly an increase in his MCV.  I am sending off a hemoglobin electrophoresis to make sure that there is no hemoglobinopathy (i.e. thalassemia) that we need to worry about.  I spent a good hour with Adam Huffman.  It was nice talking to him.  His daughter wants to go to [employer] for [employer] which I highly encouraged.   ______________________________ Josph Macho, M.D. PRE/MEDQ  D:  06/30/2012  T:  07/01/2012  Job:  [id number]

## 2012-07-04 LAB — HEMOGLOBINOPATHY EVALUATION
Hemoglobin Other: 0 %
Hgb A2 Quant: 2.2 % (ref 2.2–3.2)
Hgb A: 97.8 % (ref 96.8–97.8)
Hgb F Quant: 0 % (ref 0.0–2.0)

## 2012-07-04 LAB — IRON AND TIBC: %SAT: 5 % — ABNORMAL LOW (ref 20–55)

## 2012-07-08 ENCOUNTER — Ambulatory Visit (HOSPITAL_BASED_OUTPATIENT_CLINIC_OR_DEPARTMENT_OTHER): Payer: 59

## 2012-07-08 VITALS — BP 129/71 | HR 81 | Resp 20

## 2012-07-08 DIAGNOSIS — D509 Iron deficiency anemia, unspecified: Secondary | ICD-10-CM

## 2012-07-08 DIAGNOSIS — D510 Vitamin B12 deficiency anemia due to intrinsic factor deficiency: Secondary | ICD-10-CM

## 2012-07-08 MED ORDER — CYANOCOBALAMIN 1000 MCG/ML IJ SOLN
1000.0000 ug | Freq: Once | INTRAMUSCULAR | Status: AC
  Administered 2012-07-08: 1000 ug via INTRAMUSCULAR

## 2012-07-08 MED ORDER — SODIUM CHLORIDE 0.9 % IV SOLN
1020.0000 mg | Freq: Once | INTRAVENOUS | Status: AC
  Administered 2012-07-08: 1020 mg via INTRAVENOUS
  Filled 2012-07-08: qty 34

## 2012-07-08 NOTE — Patient Instructions (Signed)
Ferumoxytol injection What is this medicine? FERUMOXYTOL is an iron complex. Iron is used to make healthy red blood cells, which carry oxygen and nutrients throughout the body. This medicine is used to treat iron deficiency anemia in people with chronic kidney disease. This medicine may be used for other purposes; ask your health care provider or pharmacist if you have questions. What should I tell my health care provider before I take this medicine? They need to know if you have any of these conditions: -anemia not caused by low iron levels -high levels of iron in the blood -magnetic resonance imaging (MRI) test scheduled -an unusual or allergic reaction to iron, other medicines, foods, dyes, or preservatives -pregnant or trying to get pregnant -breast-feeding How should I use this medicine? This medicine is for infusion into a vein. It is given by a health care professional in a hospital or clinic setting. Talk to your pediatrician regarding the use of this medicine in children. Special care may be needed. Overdosage: If you think you've taken too much of this medicine contact a poison control center or emergency room at once. Overdosage: If you think you have taken too much of this medicine contact a poison control center or emergency room at once. NOTE: This medicine is only for you. Do not share this medicine with others. What if I miss a dose? It is important not to miss your dose. Call your doctor or health care professional if you are unable to keep an appointment. What may interact with this medicine? This medicine may interact with the following medications: -other iron products This list may not describe all possible interactions. Give your health care provider a list of all the medicines, herbs, non-prescription drugs, or dietary supplements you use. Also tell them if you smoke, drink alcohol, or use illegal drugs. Some items may interact with your medicine. What should I watch  for while using this medicine? Visit your doctor or healthcare professional regularly. Tell your doctor or healthcare professional if your symptoms do not start to get better or if they get worse. You may need blood work done while you are taking this medicine. You may need to follow a special diet. Talk to your doctor. Foods that contain iron include: whole grains/cereals, dried fruits, beans, or peas, leafy green vegetables, and organ meats (liver, kidney). What side effects may I notice from receiving this medicine? Side effects that you should report to your doctor or health care professional as soon as possible: -allergic reactions like skin rash, itching or hives, swelling of the face, lips, or tongue -breathing problems -changes in blood pressure -feeling faint or lightheaded, falls -fever or chills -flushing, sweating, or hot feelings -swelling of the ankles or feet Side effects that usually do not require medical attention (Report these to your doctor or health care professional if they continue or are bothersome.): -diarrhea -headache -nausea, vomiting -stomach pain This list may not describe all possible side effects. Call your doctor for medical advice about side effects. You may report side effects to FDA at 1-800-FDA-1088. Where should I keep my medicine? This drug is given in a hospital or clinic and will not be stored at home. NOTE: This sheet is a summary. It may not cover all possible information. If you have questions about this medicine, talk to your doctor, pharmacist, or health care provider.  2012, Elsevier/Gold Standard. (05/09/2008 9:48:25 PM) 

## 2012-08-05 ENCOUNTER — Ambulatory Visit (HOSPITAL_BASED_OUTPATIENT_CLINIC_OR_DEPARTMENT_OTHER): Payer: 59

## 2012-08-05 VITALS — BP 113/69 | HR 65 | Temp 98.7°F | Resp 20

## 2012-08-05 DIAGNOSIS — D510 Vitamin B12 deficiency anemia due to intrinsic factor deficiency: Secondary | ICD-10-CM

## 2012-08-05 MED ORDER — CYANOCOBALAMIN 1000 MCG/ML IJ SOLN
1000.0000 ug | Freq: Once | INTRAMUSCULAR | Status: AC
  Administered 2012-08-05: 1000 ug via INTRAMUSCULAR

## 2012-08-05 NOTE — Patient Instructions (Signed)

## 2012-09-02 ENCOUNTER — Telehealth: Payer: Self-pay | Admitting: Hematology & Oncology

## 2012-09-02 ENCOUNTER — Other Ambulatory Visit: Payer: 59 | Admitting: Lab

## 2012-09-02 ENCOUNTER — Ambulatory Visit: Payer: 59

## 2012-09-02 ENCOUNTER — Ambulatory Visit: Payer: 59 | Admitting: Hematology & Oncology

## 2012-09-02 NOTE — Telephone Encounter (Signed)
Left pt message to call if he wants to reschedule missed appointment

## 2012-09-02 NOTE — Telephone Encounter (Signed)
Per Alvino Chapel RN pt called answering service last night and canceled 09-02-12 appointment

## 2012-09-02 NOTE — Telephone Encounter (Signed)
Pt made 1-10 appointment

## 2012-09-09 ENCOUNTER — Ambulatory Visit: Payer: 59

## 2012-09-09 ENCOUNTER — Ambulatory Visit: Payer: 59 | Admitting: Hematology & Oncology

## 2012-09-09 ENCOUNTER — Other Ambulatory Visit: Payer: 59 | Admitting: Lab

## 2012-09-14 ENCOUNTER — Telehealth: Payer: Self-pay | Admitting: Hematology & Oncology

## 2012-09-14 NOTE — Telephone Encounter (Signed)
Pt called made 1-20 appointment

## 2012-09-19 ENCOUNTER — Ambulatory Visit (HOSPITAL_BASED_OUTPATIENT_CLINIC_OR_DEPARTMENT_OTHER): Payer: 59 | Admitting: Hematology & Oncology

## 2012-09-19 ENCOUNTER — Ambulatory Visit (HOSPITAL_BASED_OUTPATIENT_CLINIC_OR_DEPARTMENT_OTHER): Payer: 59

## 2012-09-19 ENCOUNTER — Other Ambulatory Visit (HOSPITAL_BASED_OUTPATIENT_CLINIC_OR_DEPARTMENT_OTHER): Payer: Managed Care, Other (non HMO) | Admitting: Lab

## 2012-09-19 VITALS — HR 62 | Temp 97.3°F | Resp 20 | Ht 71.0 in | Wt 178.0 lb

## 2012-09-19 DIAGNOSIS — D510 Vitamin B12 deficiency anemia due to intrinsic factor deficiency: Secondary | ICD-10-CM

## 2012-09-19 DIAGNOSIS — D509 Iron deficiency anemia, unspecified: Secondary | ICD-10-CM

## 2012-09-19 DIAGNOSIS — Z903 Acquired absence of stomach [part of]: Secondary | ICD-10-CM

## 2012-09-19 DIAGNOSIS — E164 Increased secretion of gastrin: Secondary | ICD-10-CM

## 2012-09-19 LAB — CBC WITH DIFFERENTIAL (CANCER CENTER ONLY)
BASO%: 0.2 % (ref 0.0–2.0)
EOS%: 3 % (ref 0.0–7.0)
LYMPH#: 1.8 10*3/uL (ref 0.9–3.3)
MCH: 26.5 pg — ABNORMAL LOW (ref 28.0–33.4)
MCHC: 34 g/dL (ref 32.0–35.9)
MONO%: 9.1 % (ref 0.0–13.0)
NEUT#: 2.4 10*3/uL (ref 1.5–6.5)
RDW: 19.8 % — ABNORMAL HIGH (ref 11.1–15.7)

## 2012-09-19 LAB — RETICULOCYTES (CHCC)
ABS Retic: 30.9 10*3/uL (ref 19.0–186.0)
Retic Ct Pct: 0.6 % (ref 0.4–2.3)

## 2012-09-19 LAB — IRON AND TIBC
TIBC: 354 ug/dL (ref 215–435)
UIBC: 276 ug/dL (ref 125–400)

## 2012-09-19 MED ORDER — CYANOCOBALAMIN 1000 MCG/ML IJ SOLN
1000.0000 ug | Freq: Once | INTRAMUSCULAR | Status: AC
  Administered 2012-09-19: 1000 ug via INTRAMUSCULAR

## 2012-09-19 NOTE — Progress Notes (Signed)
This office note has been dictated.

## 2012-09-19 NOTE — Progress Notes (Signed)
CC:   Titus Dubin. [name], MD,FACP,FCCP  DIAGNOSES: 1. Recurrent iron-deficiency anemia. 2. 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 3. Zollinger-Ellison syndrome-clinical remission.  CURRENT THERAPY: 1. IV iron as indicated. 2. Vitamin B12 1 mg IM every 3 few months.  INTERIM HISTORY:  Mr. Haithcock comes in for followup.  He is doing okay.  He is on a meat-free diet right now.  He is doing okay.  He had a good Christmas.  He is feeling a little bit more energetic.  We last gave him iron back in November.  At that point in time, his ferritin was 7 with an iron saturation of 5%.  He has had a gastrectomy.  As such, he does not absorb oral iron.  He has had no problems with nausea or vomiting.  He has had no change in bowel or bladder habits.  There has been no leg swelling.  There have been no rashes.  PHYSICAL EXAMINATION:  General:  This is a well-developed, well- nourished black gentleman in no obvious distress.  Vital signs: Temperature of 97.3, pulse 62, respiratory rate 20, blood pressure 117/77.  Weight was 178.  Head and neck:  Normocephalic, atraumatic skull.  There are no ocular or oral lesions.  There are no palpable cervical or supraclavicular lymph nodes.  Lungs:  Clear to percussion and auscultation bilaterally.  Cardiac:  Regular rate and rhythm with a normal S1 and S2.  There are no murmurs, rubs, or bruits.  Abdomen: Soft.  He has a well-healed laparotomy scar.  There is no fluid wave. No guarding or rebound tenderness.  There is no palpable hepatosplenomegaly.  Extremities:  No clubbing, cyanosis, or edema.  LABORATORY STUDIES:  White cell count 4.7, hemoglobin 13.5, hematocrit 39.7, platelet count 204.  MCV is 78.  IMPRESSION:  Mr. Newsome is a 52 year old gentleman with a history of Zolinger-Ellison syndrome.  He has had a gastrectomy.  He has also had part of his liver taken out.  His MCV is coming up which indicates that his body is absorbing  iron nicely.  We will go ahead and change his B12 to every 3 months now.  His last B12 level was almost 1200.  We will see what his iron studies show.  It is certainly possible that he may need another dose of IV iron.  We will see him back in 3 months.    ______________________________ Josph Macho, M.D. PRE/MEDQ  D:  09/19/2012  T:  09/19/2012  Job:  [id number]

## 2012-09-19 NOTE — Patient Instructions (Signed)

## 2012-09-26 ENCOUNTER — Telehealth: Payer: Self-pay | Admitting: *Deleted

## 2012-09-26 ENCOUNTER — Telehealth: Payer: Self-pay | Admitting: Hematology & Oncology

## 2012-09-26 NOTE — Telephone Encounter (Addendum)
Message copied by Mirian Capuchin on Mon Sep 26, 2012  3:16 PM ------      Message from: Josph Macho      Created: Tue Sep 20, 2012  7:17 AM       Call - iron is borderline.  Need Feraheme 1020mg  x 1 dose.  Please set up!!  Pete Pt given the above message.  Pt scheduled appt.

## 2012-09-26 NOTE — Telephone Encounter (Signed)
RN transferred patient call and stated patient needed Iron apt sch.  Patient sch apt for 09/27/12

## 2012-09-27 ENCOUNTER — Ambulatory Visit (HOSPITAL_BASED_OUTPATIENT_CLINIC_OR_DEPARTMENT_OTHER): Payer: 59

## 2012-09-27 VITALS — BP 122/71 | HR 76 | Temp 97.4°F | Resp 18

## 2012-09-27 DIAGNOSIS — D510 Vitamin B12 deficiency anemia due to intrinsic factor deficiency: Secondary | ICD-10-CM

## 2012-09-27 DIAGNOSIS — D509 Iron deficiency anemia, unspecified: Secondary | ICD-10-CM

## 2012-09-27 MED ORDER — SODIUM CHLORIDE 0.9 % IV SOLN
Freq: Once | INTRAVENOUS | Status: AC
  Administered 2012-09-27: 12:00:00 via INTRAVENOUS

## 2012-09-27 MED ORDER — CYANOCOBALAMIN 1000 MCG/ML IJ SOLN
1000.0000 ug | Freq: Once | INTRAMUSCULAR | Status: AC
  Administered 2012-09-27: 1000 ug via INTRAMUSCULAR

## 2012-09-27 MED ORDER — SODIUM CHLORIDE 0.9 % IV SOLN
1020.0000 mg | Freq: Once | INTRAVENOUS | Status: AC
  Administered 2012-09-27: 1020 mg via INTRAVENOUS
  Filled 2012-09-27: qty 34

## 2012-09-27 NOTE — Patient Instructions (Signed)
Cyanocobalamin, Vitamin B12 injection What is this medicine? CYANOCOBALAMIN (sye an oh koe BAL a min) is a man made form of vitamin B12. Vitamin B12 is used in the growth of healthy blood cells, nerve cells, and proteins in the body. It also helps with the metabolism of fats and carbohydrates. This medicine is used to treat people who can not absorb vitamin B12. This medicine may be used for other purposes; ask your health care provider or pharmacist if you have questions. What should I tell my health care provider before I take this medicine? They need to know if you have any of these conditions: -kidney disease -Leber's disease -megaloblastic anemia -an unusual or allergic reaction to cyanocobalamin, cobalt, other medicines, foods, dyes, or preservatives -pregnant or trying to get pregnant -breast-feeding How should I use this medicine? This medicine is injected into a muscle or deeply under the skin. It is usually given by a health care professional in a clinic or doctor's office. However, your doctor may teach you how to inject yourself. Follow all instructions. Talk to your pediatrician regarding the use of this medicine in children. Special care may be needed. Overdosage: If you think you have taken too much of this medicine contact a poison control center or emergency room at once. NOTE: This medicine is only for you. Do not share this medicine with others. What if I miss a dose? If you are given your dose at a clinic or doctor's office, call to reschedule your appointment. If you give your own injections and you miss a dose, take it as soon as you can. If it is almost time for your next dose, take only that dose. Do not take double or extra doses. What may interact with this medicine? -colchicine -heavy alcohol intake This list may not describe all possible interactions. Give your health care provider a list of all the medicines, herbs, non-prescription drugs, or dietary supplements you  use. Also tell them if you smoke, drink alcohol, or use illegal drugs. Some items may interact with your medicine. What should I watch for while using this medicine? Visit your doctor or health care professional regularly. You may need blood work done while you are taking this medicine. You may need to follow a special diet. Talk to your doctor. Limit your alcohol intake and avoid smoking to get the best benefit. What side effects may I notice from receiving this medicine? Side effects that you should report to your doctor or health care professional as soon as possible: -allergic reactions like skin rash, itching or hives, swelling of the face, lips, or tongue -blue tint to skin -chest tightness, pain -difficulty breathing, wheezing -dizziness -red, swollen painful area on the leg Side effects that usually do not require medical attention (report to your doctor or health care professional if they continue or are bothersome): -diarrhea -headache This list may not describe all possible side effects. Call your doctor for medical advice about side effects. You may report side effects to FDA at 1-800-FDA-1088. Where should I keep my medicine? Keep out of the reach of children. Store at room temperature between 15 and 30 degrees C (59 and 85 degrees F). Protect from light. Throw away any unused medicine after the expiration date. NOTE: This sheet is a summary. It may not cover all possible information. If you have questions about this medicine, talk to your doctor, pharmacist, or health care provider.  2012, Elsevier/Gold Standard. (11/28/2007 10:10:20 PM)   Ferumoxytol injection  What is  this medicine? FERUMOXYTOL is an iron complex. Iron is used to make healthy red blood cells, which carry oxygen and nutrients throughout the body. This medicine is used to treat iron deficiency anemia in people with chronic kidney disease. This medicine may be used for other purposes; ask your health care  provider or pharmacist if you have questions. What should I tell my health care provider before I take this medicine? They need to know if you have any of these conditions: -anemia not caused by low iron levels -high levels of iron in the blood -magnetic resonance imaging (MRI) test scheduled -an unusual or allergic reaction to iron, other medicines, foods, dyes, or preservatives -pregnant or trying to get pregnant -breast-feeding How should I use this medicine? This medicine is for infusion into a vein. It is given by a health care professional in a hospital or clinic setting. Talk to your pediatrician regarding the use of this medicine in children. Special care may be needed. Overdosage: If you think you've taken too much of this medicine contact a poison control center or emergency room at once. Overdosage: If you think you have taken too much of this medicine contact a poison control center or emergency room at once. NOTE: This medicine is only for you. Do not share this medicine with others. What if I miss a dose? It is important not to miss your dose. Call your doctor or health care professional if you are unable to keep an appointment. What may interact with this medicine? This medicine may interact with the following medications: -other iron products This list may not describe all possible interactions. Give your health care provider a list of all the medicines, herbs, non-prescription drugs, or dietary supplements you use. Also tell them if you smoke, drink alcohol, or use illegal drugs. Some items may interact with your medicine. What should I watch for while using this medicine? Visit your doctor or healthcare professional regularly. Tell your doctor or healthcare professional if your symptoms do not start to get better or if they get worse. You may need blood work done while you are taking this medicine. You may need to follow a special diet. Talk to your doctor. Foods that contain  iron include: whole grains/cereals, dried fruits, beans, or peas, leafy green vegetables, and organ meats (liver, kidney). What side effects may I notice from receiving this medicine? Side effects that you should report to your doctor or health care professional as soon as possible: -allergic reactions like skin rash, itching or hives, swelling of the face, lips, or tongue -breathing problems -changes in blood pressure -feeling faint or lightheaded, falls -fever or chills -flushing, sweating, or hot feelings -swelling of the ankles or feet Side effects that usually do not require medical attention (Report these to your doctor or health care professional if they continue or are bothersome.): -diarrhea -headache -nausea, vomiting -stomach pain This list may not describe all possible side effects. Call your doctor for medical advice about side effects. You may report side effects to FDA at 1-800-FDA-1088. Where should I keep my medicine? This drug is given in a hospital or clinic and will not be stored at home. NOTE: This sheet is a summary. It may not cover all possible information. If you have questions about this medicine, talk to your doctor, pharmacist, or health care provider.  2012, Elsevier/Gold Standard. (05/09/2008 9:48:25 PM)

## 2012-12-19 ENCOUNTER — Ambulatory Visit (HOSPITAL_BASED_OUTPATIENT_CLINIC_OR_DEPARTMENT_OTHER): Payer: 59 | Admitting: Hematology & Oncology

## 2012-12-19 ENCOUNTER — Ambulatory Visit (HOSPITAL_BASED_OUTPATIENT_CLINIC_OR_DEPARTMENT_OTHER): Payer: 59

## 2012-12-19 ENCOUNTER — Other Ambulatory Visit (HOSPITAL_BASED_OUTPATIENT_CLINIC_OR_DEPARTMENT_OTHER): Payer: 59 | Admitting: Lab

## 2012-12-19 VITALS — BP 128/83 | HR 66 | Temp 97.3°F | Resp 18 | Wt 178.0 lb

## 2012-12-19 DIAGNOSIS — D509 Iron deficiency anemia, unspecified: Secondary | ICD-10-CM

## 2012-12-19 DIAGNOSIS — D510 Vitamin B12 deficiency anemia due to intrinsic factor deficiency: Secondary | ICD-10-CM

## 2012-12-19 DIAGNOSIS — E164 Increased secretion of gastrin: Secondary | ICD-10-CM

## 2012-12-19 LAB — CBC WITH DIFFERENTIAL (CANCER CENTER ONLY)
BASO#: 0 10*3/uL (ref 0.0–0.2)
EOS%: 2.6 % (ref 0.0–7.0)
HCT: 38.7 % (ref 38.7–49.9)
HGB: 13.4 g/dL (ref 13.0–17.1)
LYMPH%: 37.3 % (ref 14.0–48.0)
MCH: 30.8 pg (ref 28.0–33.4)
MCHC: 34.6 g/dL (ref 32.0–35.9)
MONO%: 5.6 % (ref 0.0–13.0)
NEUT%: 54.1 % (ref 40.0–80.0)

## 2012-12-19 LAB — IRON AND TIBC
%SAT: 26 % (ref 20–55)
TIBC: 300 ug/dL (ref 215–435)

## 2012-12-19 MED ORDER — CYANOCOBALAMIN 1000 MCG/ML IJ SOLN
1000.0000 ug | Freq: Once | INTRAMUSCULAR | Status: AC
  Administered 2012-12-19: 1000 ug via INTRAMUSCULAR

## 2012-12-19 NOTE — Progress Notes (Signed)
This office note has been dictated.

## 2012-12-20 NOTE — Progress Notes (Signed)
CC:   Adam Huffman. Adam Ren, MD,FACP,FCCP  DIAGNOSES: 1. Recurrent iron-deficiency anemia. 2. Pernicious anemia. 3. 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  CURRENT THERAPY: 1. IV iron as indicated. 2. Vitamin B12 one milligram IM every 3 months.  INTERIM HISTORY:  Adam Huffman comes in for his followup.  He is looking great.  He is doing well.  His daughter got a full scholarship to [employer]. This really is helping him out an incredible amount.  He has had no issues from my point of view.  There is no numbness or tingling in his hands or feet.  He has had no fevers, sweats or chills. He has had no change in bowel or bladder habits.  He has not noted any rashes.  When we last saw him back in October, his vitamin B12 level was 1200. His ferritin was 59 with an iron saturation of 22%.  This was in January.  PHYSICAL EXAMINATION:  General:  This is a well-developed, well- nourished black gentleman in no obvious distress.  Vital signs: Temperature of 97.3, pulse 66, respiratory rate 18, blood pressure 128/83.  Weight is 178.  Head and neck:  Normocephalic, atraumatic skull.  There are no ocular or oral lesions.  There are no palpable cervical or supraclavicular lymph nodes.  Lungs:  Clear bilaterally. Cardiac:  Regular rate and rhythm with a normal S1 and S2.  There are no murmurs, rubs or bruits.  Abdomen:  Soft with good bowel sounds.  There is no palpable abdominal mass.  There is no fluid wave.  He has well- healed laparotomy scar.  There is no palpable hepatosplenomegaly.  Back: No tenderness over the spine, ribs, or hips.  Extremities:  No clubbing, cyanosis or edema.  LABORATORY STUDIES:  White cell count is 4.6, hemoglobin 13.4, hematocrit 38.7, platelet count 200,000.  MCV is 89.  IMPRESSION:  Adam Huffman is a 52 year old African American gentleman with a gastrectomy due to Zollinger-Ellison syndrome.  He also has had a partial hepatectomy.  He did get iron back in  January 2014.  I suspect that his iron studies should be good seeing that his MCV is up nicely.  We give him his B12 every 3 months.  His B12 levels have been doing fine.  We will go ahead and plan to get him back in 3 more months for followup.    ______________________________ Josph Macho, M.D. PRE/MEDQ  D:  12/19/2012  T:  12/20/2012  Job:  [id number]

## 2012-12-22 ENCOUNTER — Telehealth: Payer: Self-pay | Admitting: *Deleted

## 2012-12-22 NOTE — Telephone Encounter (Signed)
Called patient and left message on personal answering machine that his iron and Vitamin B12 are all ok per dr. Myna Hidalgo.

## 2012-12-22 NOTE — Telephone Encounter (Signed)
Message copied by Anselm Jungling on Thu Dec 22, 2012 10:41 AM ------      Message from: Arlan Organ R      Created: Mon Dec 19, 2012  4:23 PM       Call - iron and Vit B12 are ok!!  Cindee Lame ------

## 2013-03-20 ENCOUNTER — Ambulatory Visit (HOSPITAL_BASED_OUTPATIENT_CLINIC_OR_DEPARTMENT_OTHER): Payer: Managed Care, Other (non HMO) | Admitting: Hematology & Oncology

## 2013-03-20 ENCOUNTER — Ambulatory Visit (HOSPITAL_BASED_OUTPATIENT_CLINIC_OR_DEPARTMENT_OTHER): Payer: 59

## 2013-03-20 ENCOUNTER — Other Ambulatory Visit (HOSPITAL_BASED_OUTPATIENT_CLINIC_OR_DEPARTMENT_OTHER): Payer: Managed Care, Other (non HMO) | Admitting: Lab

## 2013-03-20 VITALS — BP 127/75 | HR 53 | Temp 97.7°F | Resp 18 | Ht 72.0 in | Wt 171.0 lb

## 2013-03-20 DIAGNOSIS — D509 Iron deficiency anemia, unspecified: Secondary | ICD-10-CM

## 2013-03-20 DIAGNOSIS — E164 Increased secretion of gastrin: Secondary | ICD-10-CM

## 2013-03-20 DIAGNOSIS — D510 Vitamin B12 deficiency anemia due to intrinsic factor deficiency: Secondary | ICD-10-CM

## 2013-03-20 LAB — IRON AND TIBC CHCC
%SAT: 32 % (ref 20–55)
UIBC: 216 ug/dL (ref 117–376)

## 2013-03-20 LAB — FERRITIN CHCC: Ferritin: 141 ng/ml (ref 22–316)

## 2013-03-20 MED ORDER — CYANOCOBALAMIN 1000 MCG/ML IJ SOLN
1000.0000 ug | Freq: Once | INTRAMUSCULAR | Status: AC
  Administered 2013-03-20: 1000 ug via INTRAMUSCULAR

## 2013-03-20 NOTE — Patient Instructions (Signed)

## 2013-03-20 NOTE — Progress Notes (Signed)
This office note has been dictated.

## 2013-03-21 ENCOUNTER — Telehealth: Payer: Self-pay | Admitting: Hematology & Oncology

## 2013-03-21 LAB — CBC WITH DIFFERENTIAL (CANCER CENTER ONLY)
BASO%: 0.1 % (ref 0.0–2.0)
LYMPH%: 29 % (ref 14.0–48.0)
MCV: 87 fL (ref 82–98)
MONO#: 0.5 10*3/uL (ref 0.1–0.9)
MONO%: 7.4 % (ref 0.0–13.0)
Platelets: 228 10*3/uL (ref 145–400)
RDW: 12 % (ref 11.1–15.7)
WBC: 7.3 10*3/uL (ref 4.0–10.0)

## 2013-03-21 NOTE — Progress Notes (Signed)
CC:   Adam Huffman. Hopper, MD,FACP,FCCP  DIAGNOSIS: 1. 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 2. Pernicious anemia, secondary to gastrectomy. 3. Intermittent iron-deficiency anemia. 4.  CURRENT THERAPY: 1. Vitamin B12 1 mg IM q.3 months. 2. IV iron as indicated.  INTERIM HISTORY:  Adam Huffman comes in for his followup.  He is doing quite well.  When we last saw him in April, his ferritin was 2.7 with an iron saturation of 26%.  His vitamin B12 level was 250.  He has had no issues since we last saw him.  He is working.  He has had no nausea or vomiting.  There is no increase in fatigue or weakness. There is no cough or shortness of breath.  There has been no change in bowel or bladder habits.  His daughter is going to go to [employer] for college this year.  He is looking forward to this.  PHYSICAL EXAMINATION:  General:  This is a well-developed, well- nourished, white gentleman in no obvious distress.  Vital signs: Temperature of 97.7, pulse 53, respiratory rate 18, blood pressure is 127/75.  Weight is 171.  Head and neck:  Normocephalic, atraumatic skull.  There are no ocular or oral lesions.  There are no palpable cervical or supraclavicular lymph nodes.  Lungs:  Clear bilaterally. Cardiac:  Regular rate and rhythm with a normal S1, S2.  There are no murmurs, rubs or bruits.  Abdomen:  Soft with good bowel sounds.  There is no palpable abdominal mass.  He has well-healed laparotomy scar. There is no fluid wave.  There is no palpable hepatomegaly. Extremities:  Show no clubbing, cyanosis or edema.  Neurological:  Shows no focal neurological deficits.  LABORATORY STUDIES:  White cell count 7.3, hemoglobin 15.5, hematocrit 44.7, platelet count is 228.  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  He underwent a gastrectomy.  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  I do not see that iron  deficiency is a problem right now.  We will go ahead and give him vitamin B12.  We will go ahead and plan to get him back in 3 more months.    ______________________________ Adam Huffman, M.D. PRE/MEDQ  D:  03/20/2013  T:  03/21/2013  Job:  [id number]

## 2013-03-21 NOTE — Telephone Encounter (Addendum)
Message copied by Cathi Roan on Tue Mar 21, 2013  4:03 PM ------      Message from: Arlan Organ R      Created: Mon Mar 20, 2013  5:21 PM       Please call and let him know that his iron is okay. Thanks. Cindee Lame  03-21-13 Called patients at home and left message regarding above MD note, advised if any questions to call office.  Lupita Raider LPN ------

## 2013-06-16 ENCOUNTER — Other Ambulatory Visit: Payer: Managed Care, Other (non HMO) | Admitting: Lab

## 2013-06-16 ENCOUNTER — Ambulatory Visit: Payer: Managed Care, Other (non HMO) | Admitting: Hematology & Oncology

## 2013-06-16 ENCOUNTER — Ambulatory Visit: Payer: Managed Care, Other (non HMO)

## 2014-03-27 ENCOUNTER — Ambulatory Visit (INDEPENDENT_AMBULATORY_CARE_PROVIDER_SITE_OTHER): Payer: Managed Care, Other (non HMO) | Admitting: Family Medicine

## 2014-03-27 ENCOUNTER — Encounter: Payer: Self-pay | Admitting: Family Medicine

## 2014-03-27 ENCOUNTER — Other Ambulatory Visit (INDEPENDENT_AMBULATORY_CARE_PROVIDER_SITE_OTHER): Payer: Managed Care, Other (non HMO)

## 2014-03-27 ENCOUNTER — Encounter: Payer: Self-pay | Admitting: *Deleted

## 2014-03-27 VITALS — BP 132/80 | HR 69 | Ht 70.5 in | Wt 182.0 lb

## 2014-03-27 DIAGNOSIS — IMO0002 Reserved for concepts with insufficient information to code with codable children: Secondary | ICD-10-CM

## 2014-03-27 DIAGNOSIS — S83206A Unspecified tear of unspecified meniscus, current injury, right knee, initial encounter: Secondary | ICD-10-CM | POA: Insufficient documentation

## 2014-03-27 DIAGNOSIS — M25561 Pain in right knee: Secondary | ICD-10-CM

## 2014-03-27 DIAGNOSIS — M25569 Pain in unspecified knee: Secondary | ICD-10-CM

## 2014-03-27 MED ORDER — MELOXICAM 15 MG PO TABS: 15.0000 mg | ORAL_TABLET | Freq: Every day | ORAL | Status: DC

## 2014-03-27 NOTE — Patient Instructions (Signed)
Good to see you Ice 20 minutes 2 times daily. Usually after activity and before bed. Exercises 3 times a week.  Wear brace with any activty Meloxicam daily for 10 days then as needed We are putting you on light duty next 2 weeks.  Come back in 2 weeks and wwill see how you are doing.

## 2014-03-27 NOTE — Progress Notes (Signed)
[facility] [address] [place], [place] [address] Phone: [phone] Subjective:      CC: Right knee pain  [id number] Adam Huffman is a 53 y.o. male coming in with complaint of right knee pain. Patient states that he was working the other night and was re\re standing the floor. 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 Patient return in his right knee seemed to collapse on him. Patient did have an audible pop at that time. Patient had some mild soreness but overall was able to finish the job. Patient went home that night and the continued pain occurred through the night. Patient was able to sleep but by the time he woke up that morning he started having increased swelling. Patient was seen by one of the nurses at work and was given a compression sleeve and was sent here for further evaluation. Patient states that it just feels that the knee is somewhat unstable and hurts with any type of twisting motion or going up or down stairs. Patient has been walking with a limp secondary to this. Patient has never injured this knee previously. Also pain is on the medial aspect of the knee. States that the swelling is improving slowly. Denies any radiation of pain or any weakness. Patient was the severity of 8/10 pain.     Past medical history, social, surgical and family history all reviewed in electronic medical record.   Review of Systems: No headache, visual changes, nausea, vomiting, diarrhea, constipation, dizziness, abdominal pain, skin rash, fevers, chills, night sweats, weight loss, swollen lymph nodes, body aches, joint swelling, muscle aches, chest pain, shortness of breath, mood changes.   Objective Blood pressure 132/80, pulse 69, height 5' 10.5" (1.791 m), weight 182 lb (82.555 kg), SpO2 97.00%.  General: No apparent distress alert and oriented x3 mood and affect normal, dressed appropriately.  HEENT: Pupils equal,  extraocular movements intact  Respiratory: Patient's speak in full sentences and does not appear short of breath  Cardiovascular: No lower extremity edema, non tender, no erythema  Skin: Warm dry intact with no signs of infection or rash on extremities or on axial skeleton.  Abdomen: Soft nontender  Neuro: Cranial nerves II through XII are intact, neurovascularly intact in all extremities with 2+ DTRs and 2+ pulses.  Lymph: No lymphadenopathy of posterior or anterior cervical chain or axillae bilaterally.  Gait antalgic gait MSK:  Non tender with full range of motion and good stability and symmetric strength and tone of shoulders, elbows, wrist, hip, and ankles bilaterally.  Knee: Right Trace swelling noted Palpation normal with no warmth, joint line tenderness, patellar tenderness, or condyle tenderness. She lacks the last 5 of flexion secondary to the swelling Ligaments with solid consistent endpoints including ACL, PCL, LCL, MCL. Positive Mcmurray's, Apley's, and Thessalonian tests. Non painful patellar compression. Patellar glide without crepitus. Patellar and quadriceps tendons unremarkable. Hamstring and quadriceps strength is normal.  Contralateral knee unremarkable  MSK US performed of: Right knee This study was ordered, performed, and interpreted by Charlann Boxer D.O.  Knee: All structures visualized. Patient does have a very large tear of the anterior medial meniscus. This seems to expand approximately 70% of the meniscus with hypoechoic changes. No displacement noted though. Very mild narrowing of the medial joint line. Patellar Tendon unremarkable on long and transverse views without effusion. No abnormality of prepatellar bursa. LCL and MCL unremarkable on long and transverse views. No abnormality of origin  of medial or lateral head of the gastrocnemius.  IMPRESSION:  Acute medial meniscal tear nondisplaced    Impression and Recommendations:     This case required  medical decision making of moderate complexity.

## 2014-03-27 NOTE — Assessment & Plan Note (Signed)
Patient does have an acute tear of approximately 60-70% of the medial meniscus. This goes from anterior to posterior. There is no significant displacement hypoechoic changes are noted. Injection was given today, home exercise program and bracing was fitted by me today. We discussed icing protocol. Patient will think meloxicam daily for the next 10 days. Patient will come back and see me again in 2 weeks for further evaluation and treatment. Hopefully at that point we will be able to advance him on his activity. If patient continues to have pain we need to consider advanced imaging including x-rays and MRI.

## 2014-04-10 ENCOUNTER — Ambulatory Visit (INDEPENDENT_AMBULATORY_CARE_PROVIDER_SITE_OTHER): Payer: Managed Care, Other (non HMO) | Admitting: Family Medicine

## 2014-04-10 ENCOUNTER — Ambulatory Visit (INDEPENDENT_AMBULATORY_CARE_PROVIDER_SITE_OTHER)
Admission: RE | Admit: 2014-04-10 | Discharge: 2014-04-10 | Disposition: A | Discharge status: Home / Self Care | Payer: Managed Care, Other (non HMO) | Source: Ambulatory Visit | Attending: Family Medicine | Admitting: Family Medicine

## 2014-04-10 ENCOUNTER — Encounter: Payer: Self-pay | Admitting: Family Medicine

## 2014-04-10 ENCOUNTER — Encounter: Payer: Self-pay | Admitting: *Deleted

## 2014-04-10 VITALS — BP 133/86 | HR 54 | Ht 70.5 in | Wt 179.0 lb

## 2014-04-10 DIAGNOSIS — S83206D Unspecified tear of unspecified meniscus, current injury, right knee, subsequent encounter: Secondary | ICD-10-CM

## 2014-04-10 DIAGNOSIS — Z5189 Encounter for other specified aftercare: Secondary | ICD-10-CM

## 2014-04-10 MED ORDER — TRAMADOL HCL 50 MG PO TABS: 50.0000 mg | ORAL_TABLET | Freq: Two times a day (BID) | ORAL | Status: DC | PRN

## 2014-04-10 NOTE — Progress Notes (Signed)
  [facility] [place],  [address] Phone: [phone] Subjective:      CC: Right knee pain followup.  [id number] Adam Huffman is a 53 y.o. male coming in with complaint of right knee pain. Patient was found to have an acute meniscal tear of the right knee. Due to the acute nature of the patient elected on the conservative treatment. Patient did not want an injection. Patient was given a brace, icing protocol and home exercises. Patient unfortunately states that he is worse. States that the pain is causing him to continue to have difficulty at work and is waking him up at night. Patient continues to take the tramadol without any significant improvement. Once again patient did have injury while he was at work and twisted his knee in a certain position that caused an audible popping sensation. Patient states that this is affecting her daily activity such as going up and down stairs. Patient is of severity of pain is 8-9/10. 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 Patient feels at this point he cannot do his job safely.      Past medical history, social, surgical and family history all reviewed in electronic medical record.   Review of Systems: No headache, visual changes, nausea, vomiting, diarrhea, constipation, dizziness, abdominal pain, skin rash, fevers, chills, night sweats, weight loss, swollen lymph nodes, body aches, joint swelling, muscle aches, chest pain, shortness of breath, mood changes.   Objective Blood pressure 133/86, pulse 54, height 5' 10.5" (1.791 m), weight 179 lb (81.194 kg).  General: No apparent distress alert and oriented x3 mood and affect normal, dressed appropriately.  HEENT: Pupils equal, extraocular movements intact  Respiratory: Patient's speak in full sentences and does not appear short of breath  Cardiovascular: No lower extremity edema, non tender, no  erythema  Skin: Warm dry intact with no signs of infection or rash on extremities or on axial skeleton.  Abdomen: Soft nontender  Neuro: Cranial nerves II through XII are intact, neurovascularly intact in all extremities with 2+ DTRs and 2+ pulses.  Lymph: No lymphadenopathy of posterior or anterior cervical chain or axillae bilaterally.  Gait antalgic gait MSK:  Non tender with full range of motion and good stability and symmetric strength and tone of shoulders, elbows, wrist, hip, and ankles bilaterally.  Knee: Right Trace swelling noted  patient is to tenderness to palpation over the medial joint line   lacks the last 5 of flexion  Ligaments with solid consistent endpoints including ACL, PCL, LCL, MCL. Positive Mcmurray's, Apley's, and Thessalonian tests. Non painful patellar compression. Patellar glide without crepitus. Patellar and quadriceps tendons unremarkable. Hamstring and quadriceps strength is normal.  Contralateral knee unremarkable     Impression and Recommendations:     This case required medical decision making of moderate complexity.

## 2014-04-10 NOTE — Assessment & Plan Note (Signed)
Patient continues to have pain in has signs and symptoms of internal derangement. I do believe that further imaging is necessary and x-rays and MRI were ordered today. I do not feel the patient is able to do a lot of twisting motion or lifting at this time and they do not have light duty at his job so we will hold him out until advance imaging is done. Depending on this imaging this will discuss what our next treatment options would be and what would be available. We discussed alternative therapies at this time and patient agreed with this plan. We discussed continuing the icing and patient was given a prescription for tramadol to take at night. Patient will continue to wear the brace and will come back and see me again after the MRI so we can discuss further treatment  Spent greater than 25 minutes with patient face-to-face and had greater than 50% of counseling including as described above in assessment and plan.

## 2014-04-10 NOTE — Patient Instructions (Signed)
I am sorry you are still hurting.  Ice will help some.  Try tramadol for breakthrough pain Can continue the meloxicam as long as it doesn't hurt your stomach Xrays downstairs today  MRI ordered and they will call you .  Once you know when the MRI is, call us and make ana ppintment 1-2 days afterward.  See you soon.

## 2014-04-19 ENCOUNTER — Ambulatory Visit
Admission: RE | Admit: 2014-04-19 | Discharge: 2014-04-19 | Disposition: A | Discharge status: Home / Self Care | Payer: Managed Care, Other (non HMO) | Source: Ambulatory Visit | Attending: Family Medicine | Admitting: Family Medicine

## 2014-04-19 DIAGNOSIS — S83206D Unspecified tear of unspecified meniscus, current injury, right knee, subsequent encounter: Secondary | ICD-10-CM

## 2014-04-23 ENCOUNTER — Encounter: Payer: Self-pay | Admitting: Family Medicine

## 2014-04-23 ENCOUNTER — Ambulatory Visit (INDEPENDENT_AMBULATORY_CARE_PROVIDER_SITE_OTHER): Payer: Managed Care, Other (non HMO) | Admitting: Family Medicine

## 2014-04-23 VITALS — BP 140/88 | HR 59 | Ht 70.5 in | Wt 179.0 lb

## 2014-04-23 DIAGNOSIS — S83206D Unspecified tear of unspecified meniscus, current injury, right knee, subsequent encounter: Secondary | ICD-10-CM

## 2014-04-23 DIAGNOSIS — Z5189 Encounter for other specified aftercare: Secondary | ICD-10-CM

## 2014-04-23 NOTE — Patient Instructions (Signed)
Good to see you I do believe your meniscal tear is causing the pain.  We will send you to Ortho to discuss [place].  Sorrel We will get you an appointment With Sarita Bottom or Mayer Camel.  I am here if you need anything else.

## 2014-04-23 NOTE — Assessment & Plan Note (Signed)
Discussed with patient at great length. At this time he continues to have significant amount of pain and has not made any significant improvement with conservative therapy including icing, home exercises, bracing, physical therapy as well as corticosteroids. Patient livelihood and employment is at risk. We discussed the possibility of continuing a six-week trial of another round of physical therapy versus referral to orthopedic surgeon for the possibility of surgical intervention for the meniscal tear. We discussed the timing and duration of been out of work for either of these and patient has elected to discuss the possibility of surgical intervention. Patient has been referred and does have an appointment tomorrow. Patient knows that I am always willing to help if he has any questions he can call. Patient will continue the bracing, icing, as well as medications have been prescribed previous. He will followup with me on an as-needed basis.  Spent greater than 25 minutes with patient face-to-face and had greater than 50% of counseling including as described above in assessment and plan.

## 2014-04-23 NOTE — Progress Notes (Signed)
[facility] [address] [place], [place] [address] Phone: [phone] Subjective:      CC: Right knee pain followup.  Adam Huffman is a 53 y.o. male coming in with complaint of right knee pain. Patient was found to have an acute meniscal tear of the right knee. Due to the acute nature of the patient elected on the conservative treatment. Patient did not want an injection. Patient was given a brace, icing protocol and home exercises. Patient unfortunately states that he is worse. States that the pain is causing him to continue to have difficulty at work and is waking him up at night. Patient continues to take the tramadol without any significant improvement. Once again patient did have injury while he was at work and twisted his knee in a certain position that caused an audible popping sensation. Patient continued to have significant difficulty doing activities of daily living as well as working. Patient did have an MRI. Patient is here to review the MRI. MRI showed a patient did have slight inflammation surrounding the medial patellofemoral ligament as well as a horizontal tear on the undersurface of the mid body and posterior horn of the medial meniscus.  The patient continues to be out of work at this time. Patient continues to have pain. Patient has been unable to work secondary to this amount of pain. Patient continues to not be able to do any twisting or rotational movements and anything such as walking a long amount of time or standing long amount of time this isn't severe. Patient states that this is affecting his livelihood. Patient rates her pain still is 8/10. Still having a catching sensation that occurs.  Patient has failed all conservative therapy including icing, home exercises, bracing, physical therapy, as well as corticosteroid injections.      Past medical history, social, surgical and family history all reviewed in electronic  medical record.   Review of Systems: No headache, visual changes, nausea, vomiting, diarrhea, constipation, dizziness, abdominal pain, skin rash, fevers, chills, night sweats, weight loss, swollen lymph nodes, body aches, joint swelling, muscle aches, chest pain, shortness of breath, mood changes.   Objective Blood pressure 140/88, pulse 59, height 5' 10.5" (1.791 m), weight 179 lb (81.194 kg), SpO2 97.00%.  General: No apparent distress alert and oriented x3 mood and affect normal, dressed appropriately.  HEENT: Pupils equal, extraocular movements intact  Respiratory: Patient's speak in full sentences and does not appear short of breath  Cardiovascular: No lower extremity edema, non tender, no erythema  Skin: Warm dry intact with no signs of infection or rash on extremities or on axial skeleton.  Abdomen: Soft nontender  Neuro: Cranial nerves II through XII are intact, neurovascularly intact in all extremities with 2+ DTRs and 2+ pulses.  Lymph: No lymphadenopathy of posterior or anterior cervical chain or axillae bilaterally.  Gait antalgic gait MSK:  Non tender with full range of motion and good stability and symmetric strength and tone of shoulders, elbows, wrist, hip, and ankles bilaterally.  Knee: Right Trace swelling noted  patient is to tenderness to palpation over the medial joint line   lacks the last 5 of flexion  Ligaments with solid consistent endpoints including ACL, PCL, LCL, MCL. Positive Mcmurray's, Apley's, and Thessalonian tests. Non painful patellar compression. Patellar glide without crepitus. Patellar and quadriceps tendons unremarkable. Hamstring and quadriceps strength is normal.  Contralateral knee unremarkable     Impression and Recommendations:     This  case required medical decision making of moderate complexity.

## 2014-04-24 ENCOUNTER — Other Ambulatory Visit: Payer: Self-pay | Admitting: Orthopedic Surgery

## 2014-04-24 ENCOUNTER — Encounter: Payer: Self-pay | Admitting: Internal Medicine

## 2014-04-24 ENCOUNTER — Encounter (HOSPITAL_BASED_OUTPATIENT_CLINIC_OR_DEPARTMENT_OTHER): Payer: Self-pay | Admitting: *Deleted

## 2014-04-24 DIAGNOSIS — G4710 Hypersomnia, unspecified: Secondary | ICD-10-CM | POA: Insufficient documentation

## 2014-04-24 DIAGNOSIS — G4730 Sleep apnea, unspecified: Secondary | ICD-10-CM

## 2014-04-24 NOTE — Progress Notes (Signed)
Pt has no cardiac or resp problems

## 2014-04-24 NOTE — Progress Notes (Signed)
04/24/14 1626  OBSTRUCTIVE SLEEP APNEA  Have you ever been diagnosed with sleep apnea through a sleep study? No  Do you snore loudly (loud enough to be heard through closed doors)?  1  Do you often feel tired, fatigued, or sleepy during the daytime? 0  Has anyone observed you stop breathing during your sleep? 0  Do you have, or are you being treated for high blood pressure? 0  BMI more than 35 kg/m2? 0  Age over 51 years old? 1  Neck circumference greater than 40 cm/16 inches? 1  Gender: 1  Obstructive Sleep Apnea Score 4  Score 4 or greater  Results sent to PCP

## 2014-04-25 ENCOUNTER — Encounter (HOSPITAL_BASED_OUTPATIENT_CLINIC_OR_DEPARTMENT_OTHER)
Admission: RE | Disposition: A | Discharge status: Home / Self Care | Payer: Self-pay | Source: Ambulatory Visit | Attending: Orthopedic Surgery

## 2014-04-25 ENCOUNTER — Ambulatory Visit (HOSPITAL_BASED_OUTPATIENT_CLINIC_OR_DEPARTMENT_OTHER): Payer: Managed Care, Other (non HMO) | Admitting: Certified Registered"

## 2014-04-25 ENCOUNTER — Ambulatory Visit (HOSPITAL_COMMUNITY): Payer: Managed Care, Other (non HMO)

## 2014-04-25 ENCOUNTER — Encounter (HOSPITAL_BASED_OUTPATIENT_CLINIC_OR_DEPARTMENT_OTHER): Payer: Self-pay | Admitting: *Deleted

## 2014-04-25 ENCOUNTER — Ambulatory Visit (HOSPITAL_BASED_OUTPATIENT_CLINIC_OR_DEPARTMENT_OTHER)
Admission: RE | Admit: 2014-04-25 | Discharge: 2014-04-25 | Disposition: A | Discharge status: Home / Self Care | Payer: Managed Care, Other (non HMO) | Source: Ambulatory Visit | Attending: Orthopedic Surgery | Admitting: Orthopedic Surgery

## 2014-04-25 ENCOUNTER — Encounter (HOSPITAL_BASED_OUTPATIENT_CLINIC_OR_DEPARTMENT_OTHER): Payer: Managed Care, Other (non HMO) | Admitting: Certified Registered"

## 2014-04-25 DIAGNOSIS — M23329 Other meniscus derangements, posterior horn of medial meniscus, unspecified knee: Secondary | ICD-10-CM | POA: Insufficient documentation

## 2014-04-25 DIAGNOSIS — Z79899 Other long term (current) drug therapy: Secondary | ICD-10-CM | POA: Diagnosis not present

## 2014-04-25 DIAGNOSIS — Z8505 Personal history of malignant neoplasm of liver: Secondary | ICD-10-CM | POA: Insufficient documentation

## 2014-04-25 DIAGNOSIS — Z9089 Acquired absence of other organs: Secondary | ICD-10-CM | POA: Diagnosis not present

## 2014-04-25 DIAGNOSIS — Z8509 Personal history of malignant neoplasm of other digestive organs: Secondary | ICD-10-CM | POA: Diagnosis not present

## 2014-04-25 DIAGNOSIS — G4730 Sleep apnea, unspecified: Secondary | ICD-10-CM | POA: Diagnosis not present

## 2014-04-25 HISTORY — PX: KNEE ARTHROSCOPY: SHX127

## 2014-04-25 SURGERY — ARTHROSCOPY, KNEE
Anesthesia: General | Site: Knee | Laterality: Right

## 2014-04-25 MED ORDER — CHLORHEXIDINE GLUCONATE 4 % EX LIQD: 60.0000 mL | Freq: Once | CUTANEOUS | Status: DC

## 2014-04-25 MED ORDER — ONDANSETRON HCL 4 MG/2ML IJ SOLN: 4.0000 mg | Freq: Four times a day (QID) | INTRAMUSCULAR | Status: DC | PRN

## 2014-04-25 MED ORDER — EPINEPHRINE HCL 1 MG/ML IJ SOLN
INTRAMUSCULAR | Status: AC
  Filled 2014-04-25: qty 1

## 2014-04-25 MED ORDER — DEXAMETHASONE SODIUM PHOSPHATE 4 MG/ML IJ SOLN
INTRAMUSCULAR | Status: DC | PRN
  Administered 2014-04-25: 10 mg via INTRAVENOUS

## 2014-04-25 MED ORDER — CEFAZOLIN SODIUM-DEXTROSE 2-3 GM-% IV SOLR
INTRAVENOUS | Status: AC
  Filled 2014-04-25: qty 50

## 2014-04-25 MED ORDER — LIDOCAINE HCL (CARDIAC) 20 MG/ML IV SOLN
INTRAVENOUS | Status: DC | PRN
  Administered 2014-04-25: 60 mg via INTRAVENOUS

## 2014-04-25 MED ORDER — ONDANSETRON HCL 4 MG/2ML IJ SOLN
INTRAMUSCULAR | Status: DC | PRN
  Administered 2014-04-25: 4 mg via INTRAVENOUS

## 2014-04-25 MED ORDER — OXYCODONE HCL 5 MG/5ML PO SOLN: 5.0000 mg | Freq: Once | ORAL | Status: AC | PRN

## 2014-04-25 MED ORDER — OXYCODONE-ACETAMINOPHEN 5-325 MG PO TABS: 1.0000 | ORAL_TABLET | Freq: Four times a day (QID) | ORAL | Status: DC | PRN

## 2014-04-25 MED ORDER — BUPIVACAINE HCL (PF) 0.5 % IJ SOLN
INTRAMUSCULAR | Status: DC | PRN
  Administered 2014-04-25: 20 mL

## 2014-04-25 MED ORDER — FENTANYL CITRATE 0.05 MG/ML IJ SOLN: 50.0000 ug | INTRAMUSCULAR | Status: DC | PRN

## 2014-04-25 MED ORDER — CEFAZOLIN SODIUM-DEXTROSE 2-3 GM-% IV SOLR
2.0000 g | INTRAVENOUS | Status: AC
  Administered 2014-04-25: 2 g via INTRAVENOUS

## 2014-04-25 MED ORDER — FENTANYL CITRATE 0.05 MG/ML IJ SOLN
25.0000 ug | INTRAMUSCULAR | Status: DC | PRN
  Administered 2014-04-25 (×3): 25 ug via INTRAVENOUS

## 2014-04-25 MED ORDER — BUPIVACAINE HCL (PF) 0.25 % IJ SOLN
INTRAMUSCULAR | Status: AC
  Filled 2014-04-25: qty 30

## 2014-04-25 MED ORDER — LACTATED RINGERS IV SOLN
INTRAVENOUS | Status: DC
  Administered 2014-04-25 (×2): via INTRAVENOUS

## 2014-04-25 MED ORDER — BUPIVACAINE HCL (PF) 0.5 % IJ SOLN
INTRAMUSCULAR | Status: AC
  Filled 2014-04-25: qty 30

## 2014-04-25 MED ORDER — FENTANYL CITRATE 0.05 MG/ML IJ SOLN
INTRAMUSCULAR | Status: AC
  Filled 2014-04-25: qty 6

## 2014-04-25 MED ORDER — FENTANYL CITRATE 0.05 MG/ML IJ SOLN
INTRAMUSCULAR | Status: AC
  Filled 2014-04-25: qty 2

## 2014-04-25 MED ORDER — OXYCODONE HCL 5 MG PO TABS
5.0000 mg | ORAL_TABLET | Freq: Once | ORAL | Status: AC | PRN
  Administered 2014-04-25: 5 mg via ORAL

## 2014-04-25 MED ORDER — HYDROMORPHONE HCL PF 1 MG/ML IJ SOLN
INTRAMUSCULAR | Status: AC
  Filled 2014-04-25: qty 1

## 2014-04-25 MED ORDER — OXYCODONE HCL 5 MG PO TABS
ORAL_TABLET | ORAL | Status: AC
  Filled 2014-04-25: qty 1

## 2014-04-25 MED ORDER — PROPOFOL 10 MG/ML IV BOLUS
INTRAVENOUS | Status: DC | PRN
  Administered 2014-04-25: 200 mg via INTRAVENOUS

## 2014-04-25 MED ORDER — MIDAZOLAM HCL 5 MG/5ML IJ SOLN
INTRAMUSCULAR | Status: DC | PRN
  Administered 2014-04-25: 1 mg via INTRAVENOUS

## 2014-04-25 MED ORDER — SODIUM CHLORIDE 0.9 % IR SOLN
Status: DC | PRN
  Administered 2014-04-25: 3000 mL

## 2014-04-25 MED ORDER — MIDAZOLAM HCL 2 MG/2ML IJ SOLN
INTRAMUSCULAR | Status: AC
  Filled 2014-04-25: qty 2

## 2014-04-25 MED ORDER — MIDAZOLAM HCL 2 MG/2ML IJ SOLN: 1.0000 mg | INTRAMUSCULAR | Status: DC | PRN

## 2014-04-25 MED ORDER — FENTANYL CITRATE 0.05 MG/ML IJ SOLN
INTRAMUSCULAR | Status: DC | PRN
Start: 2014-04-25 — End: 2014-04-25
  Administered 2014-04-25 (×2): 50 ug via INTRAVENOUS

## 2014-04-25 MED ORDER — TRAMADOL HCL 50 MG PO TABS: 50.0000 mg | ORAL_TABLET | Freq: Four times a day (QID) | ORAL | Status: DC | PRN

## 2014-04-25 SURGICAL SUPPLY — 41 items
BANDAGE ELASTIC 6 VELCRO ST LF (GAUZE/BANDAGES/DRESSINGS) ×3 IMPLANT
BLADE 4.2CUDA (BLADE) IMPLANT
BLADE GREAT WHITE 4.2 (BLADE) ×2 IMPLANT
BLADE GREAT WHITE 4.2MM (BLADE) ×1
CANISTER SUCT 3000ML (MISCELLANEOUS) IMPLANT
CUTTER MENISCUS  4.2MM (BLADE)
CUTTER MENISCUS 4.2MM (BLADE) IMPLANT
DRAPE ARTHROSCOPY W/POUCH 114 (DRAPES) ×3 IMPLANT
DRSG EMULSION OIL 3X3 NADH (GAUZE/BANDAGES/DRESSINGS) ×3 IMPLANT
DURAPREP 26ML APPLICATOR (WOUND CARE) ×3 IMPLANT
ELECT MENISCUS 165MM 90D (ELECTRODE) IMPLANT
ELECT REM PT RETURN 9FT ADLT (ELECTROSURGICAL)
ELECTRODE REM PT RTRN 9FT ADLT (ELECTROSURGICAL) IMPLANT
GAUZE SPONGE 4X4 12PLY STRL (GAUZE/BANDAGES/DRESSINGS) ×3 IMPLANT
GLOVE BIOGEL PI IND STRL 7.0 (GLOVE) IMPLANT
GLOVE BIOGEL PI IND STRL 8 (GLOVE) ×2 IMPLANT
GLOVE BIOGEL PI INDICATOR 7.0 (GLOVE) ×2
GLOVE BIOGEL PI INDICATOR 8 (GLOVE) ×4
GLOVE ECLIPSE 6.5 STRL STRAW (GLOVE) ×2 IMPLANT
GLOVE ECLIPSE 7.5 STRL STRAW (GLOVE) ×6 IMPLANT
GOWN STRL REUS W/ TWL LRG LVL3 (GOWN DISPOSABLE) ×1 IMPLANT
GOWN STRL REUS W/ TWL XL LVL3 (GOWN DISPOSABLE) ×1 IMPLANT
GOWN STRL REUS W/TWL LRG LVL3 (GOWN DISPOSABLE) ×3
GOWN STRL REUS W/TWL XL LVL3 (GOWN DISPOSABLE) ×6 IMPLANT
HOLDER KNEE FOAM BLUE (MISCELLANEOUS) ×3 IMPLANT
KNEE WRAP E Z 3 GEL PACK (MISCELLANEOUS) ×3 IMPLANT
MANIFOLD NEPTUNE II (INSTRUMENTS) ×2 IMPLANT
NDL SAFETY ECLIPSE 18X1.5 (NEEDLE) IMPLANT
NEEDLE HYPO 18GX1.5 SHARP (NEEDLE) ×3
PACK ARTHROSCOPY DSU (CUSTOM PROCEDURE TRAY) ×3 IMPLANT
PACK BASIN DAY SURGERY FS (CUSTOM PROCEDURE TRAY) ×3 IMPLANT
PAD CAST 4YDX4 CTTN HI CHSV (CAST SUPPLIES) ×1 IMPLANT
PADDING CAST COTTON 4X4 STRL (CAST SUPPLIES) ×3
PENCIL BUTTON HOLSTER BLD 10FT (ELECTRODE) IMPLANT
SET ARTHROSCOPY TUBING (MISCELLANEOUS) ×3
SET ARTHROSCOPY TUBING LN (MISCELLANEOUS) ×1 IMPLANT
SUT ETHILON 4 0 PS 2 18 (SUTURE) IMPLANT
SYR 5ML LL (SYRINGE) ×3 IMPLANT
TOWEL OR 17X24 6PK STRL BLUE (TOWEL DISPOSABLE) ×3 IMPLANT
TOWEL OR NON WOVEN STRL DISP B (DISPOSABLE) ×3 IMPLANT
WATER STERILE IRR 1000ML POUR (IV SOLUTION) ×3 IMPLANT

## 2014-04-25 NOTE — Brief Op Note (Signed)
04/25/2014  12:48 PM  PATIENT:  Adam Huffman  53 y.o. male  PRE-OPERATIVE DIAGNOSIS:  medial meniscus tear right knee  POST-OPERATIVE DIAGNOSIS:  medial meniscus tear right knee  PROCEDURE:  Procedure(s): ARTHROSCOPY RIGHT KNEE, MEDIAL MENISECTOMY, MEDIAL PLICA (Right)  SURGEON:  Surgeon(s) and Role:    * Alta Corning, MD - Primary  PHYSICIAN ASSISTANT:   ASSISTANTS: bethune   ANESTHESIA:   general  EBL:  Total I/O In: 1000 [I.V.:1000] Out: -   BLOOD ADMINISTERED:none  DRAINS: none   LOCAL MEDICATIONS USED:  MARCAINE     SPECIMEN:  No Specimen  DISPOSITION OF SPECIMEN:  N/A  COUNTS:  YES  TOURNIQUET:  * No tourniquets in log *  DICTATION: .Other Dictation: Dictation Number [id number]  PLAN OF CARE: Discharge to home after PACU  PATIENT DISPOSITION:  PACU - hemodynamically stable.   Delay start of Pharmacological VTE agent (>24hrs) due to surgical blood loss or risk of bleeding: no

## 2014-04-25 NOTE — Anesthesia Postprocedure Evaluation (Signed)
Anesthesia Post Note  Patient: Adam Huffman  Procedure(s) Performed: Procedure(s) (LRB): ARTHROSCOPY RIGHT KNEE, MEDIAL MENISECTOMY, MEDIAL PLICA (Right)  Anesthesia type: General  Patient location: PACU  Post pain: Pain level controlled and Adequate analgesia  Post assessment: Post-op Vital signs reviewed, Patient's Cardiovascular Status Stable, Respiratory Function Stable, Patent Airway and Pain level controlled  Last Vitals:  Filed Vitals:   04/25/14 1330  BP: 145/91  Pulse: 49  Temp:   Resp: 20    Post vital signs: Reviewed and stable  Level of consciousness: awake, alert  and oriented  Complications: No apparent anesthesia complications

## 2014-04-25 NOTE — Transfer of Care (Signed)
Immediate Anesthesia Transfer of Care Note  Patient: Adam Huffman  Procedure(s) Performed: Procedure(s): ARTHROSCOPY RIGHT KNEE, MEDIAL MENISECTOMY, MEDIAL PLICA (Right)  Patient Location: PACU  Anesthesia Type:General  Level of Consciousness: awake, sedated and responds to stimulation  Airway & Oxygen Therapy: Patient Spontanous Breathing and Patient connected to face mask oxygen  Post-op Assessment: Report given to PACU RN, Post -op Vital signs reviewed and stable and Patient moving all extremities  Post vital signs: Reviewed and stable  Complications: No apparent anesthesia complications

## 2014-04-25 NOTE — H&P (Signed)
PREOPERATIVE H&P  Chief Complaint: r knee pain  HPI: Adam Huffman is a 53 y.o. male who presents for evaluation of r knee pain. It has been present for greater than 3 months and has been worsening. He has failed conservative measures. Pain is rated as moderate.  Past Medical History  Diagnosis Date  . Anemia, iron deficiency 06/30/2012  . Pancreas cancer 2009    Dr Doyle Askew  . Liver cancer 2009   Past Surgical History  Procedure Laterality Date  . Pancreas surgery  2009    [facility]  . Liver surgery  2009    with [facility]cyst[facility]  . Abdominal adhesion surgery  1984    TOTAL Gastrectomy-  . Total gastrectomy  1981  . Colonoscopy    . Upper gi endoscopy     History   Social History  . Marital Status: Married    Spouse Name: N/A    Number of Children: N/A  . Years of Education: N/A   Social History Main Topics  . Smoking status: Never Smoker   . Smokeless tobacco: None  . Alcohol Use: No  . Drug Use: No  . Sexual Activity: No   Other Topics Concern  . None   Social History Narrative  . None   Family History  Problem Relation Age of Onset  . Heart disease Mother     CM  . Diabetes Brother   . Heart disease Maternal Aunt     several  . Heart disease Maternal Uncle     several  . Stroke Maternal Uncle   . Heart failure Brother   . Heart failure Sister    No Known Allergies Prior to Admission medications   Medication Sig Start Date End Date Taking? Authorizing Provider  meloxicam (MOBIC) 15 MG tablet Take 1 tablet (15 mg total) by mouth daily. 03/27/14  Yes Lyndal Pulley, DO  Multiple Vitamins-Minerals (CENTRUM PO) Take 1 tablet by mouth daily.     Yes Historical Provider, MD  traMADol (ULTRAM) 50 MG tablet Take 1 tablet (50 mg total) by mouth every 12 (twelve) hours as needed for severe pain. 04/10/14  Yes Lyndal Pulley, DO     Positive ROS: none  All other systems have been reviewed and were otherwise negative with the exception of  those mentioned in the HPI and as above.  Physical Exam: There were no vitals filed for this visit.  General: Alert, no acute distress Cardiovascular: No pedal edema Respiratory: No cyanosis, no use of accessory musculature GI: No organomegaly, abdomen is soft and non-tender Skin: No lesions in the area of chief complaint Neurologic: Sensation intact distally Psychiatric: [name] with normal mood and affect Lymphatic: No axillary or cervical lymphadenopathy  MUSCULOSKELETAL: r knee: +med jt line tender//+mcmurray//-instability  Assessment/Plan: medial meniscus tear right knee Plan for Procedure(s): ARTHROSCOPY RIGHT KNEE  The risks benefits and alternatives were discussed with the patient including but not limited to the risks of nonoperative treatment, versus surgical intervention including infection, bleeding, nerve injury, malunion, nonunion, hardware prominence, hardware failure, need for hardware removal, blood clots, cardiopulmonary complications, morbidity, mortality, among others, and they were willing to proceed.  Predicted outcome is good, although there will be at least a six to nine month expected recovery.  Ximena Todaro L, MD 04/25/2014 11:22 AM

## 2014-04-25 NOTE — Discharge Instructions (Signed)
POST-OP KNEE ARTHROSCOPY INSTRUCTIONS  Dr. Alain Marion PA-C  Pain You will be expected to have a moderate amount of pain in the affected knee for approximately two weeks. However, the first two days will be the most severe pain. A prescription has been provided to take as needed for the pain. The pain can be reduced by applying ice packs to the knee for the first 1-2 weeks post surgery. Also, keeping the leg elevated on pillows will help alleviate the pain. If you develop any acute pain or swelling in your calf muscle, please call the doctor.  Activity It is preferred that you stay at bed rest for approximately 24 hours. However, you may go to the bathroom with help. Weight bearing as tolerated. You may begin the knee exercises the day of surgery. Discontinue crutches as the knee pain resolves.  Dressing Keep the dressing dry. If the ace bandage should wrinkle or roll up, this can be rewrapped to prevent ridges in the bandage. You may remove all dressings in 48 hours,  apply bandaids to each wound. You may shower on the 4th day after surgery but no tub bath.  Symptoms to report to your doctor Extreme pain Extreme swelling Temperature above 101 degrees Change in the feeling, color, or movement of your toes Redness, heat, or swelling at your incision  Exercise If is preferred that as soon as possible you try to do a straight leg raise without bending the knee and concentrate on bringing the heel of your foot off the bed up to approximately 45 degrees and hold for the count of 10 seconds. Repeat this at least 10 times three or four times per day. Additional exercises are provided below.  You are encouraged to bend the knee as tolerated.  Follow-Up Call to schedule a follow-up appointment in 5-7 days. Our office # is [phone].  POST-OP EXERCISES  Short Arc Quads  1. Lie on back with legs straight. Place towel roll under thigh, just above knee. 2. Tighten thigh muscles to  straighten knee and lift heel off bed. 3. Hold for slow count of five, then lower. 4. Do three sets of ten    Straight Leg Raises  1. Lie on back with operative leg straight and other leg bent. 2. Keeping operative leg completely straight, slowly lift operative leg so foot is 5 inches off bed. 3. Hold for slow count of five, then lower. 4. Do three sets of ten.    DO BOTH EXERCISES 2 TIMES A DAY  Ankle Pumps  Work/move the operative ankle and foot up and down 10 times every hour while awake.  Call your surgeon if you experience:   1.  Fever over 101.0. 2.  Inability to urinate. 3.  Nausea and/or vomiting. 4.  Extreme swelling or bruising at the surgical site. 5.  Continued bleeding from the incision. 6.  Increased pain, redness or drainage from the incision. 7.  Problems related to your pain medication. 8. Any change in color, movement and/or sensation 9. Any problems and/or concerns   Post Anesthesia Home Care Instructions  Activity: Get plenty of rest for the remainder of the day. A responsible adult should stay with you for 24 hours following the procedure.  For the next 24 hours, DO NOT: -Drive a car -Paediatric nurse -Drink alcoholic beverages -Take any medication unless instructed by your physician -Make any legal decisions or sign important papers.  Meals: Start with liquid foods such as gelatin or soup. Progress to  regular foods as tolerated. Avoid greasy, spicy, heavy foods. If nausea and/or vomiting occur, drink only clear liquids until the nausea and/or vomiting subsides. Call your physician if vomiting continues.  Special Instructions/Symptoms: Your throat may feel dry or sore from the anesthesia or the breathing tube placed in your throat during surgery. If this causes discomfort, gargle with warm salt water. The discomfort should disappear within 24 hours.

## 2014-04-25 NOTE — Anesthesia Preprocedure Evaluation (Signed)
Anesthesia Evaluation  Patient identified by MRN, date of birth, ID band Patient awake    Reviewed: Allergy & Precautions, H&P , NPO status , Patient's Chart, lab work & pertinent test results  Airway Mallampati: I  Neck ROM: full    Dental   Pulmonary sleep apnea ,          Cardiovascular negative cardio ROS      Neuro/Psych    GI/Hepatic H/o pancreatic CA and liver CA   Endo/Other    Renal/GU      Musculoskeletal   Abdominal   Peds  Hematology   Anesthesia Other Findings   Reproductive/Obstetrics                           Anesthesia Physical Anesthesia Plan  ASA: II  Anesthesia Plan: General   Post-op Pain Management:    Induction: Intravenous  Airway Management Planned: LMA  Additional Equipment:   Intra-op Plan:   Post-operative Plan:   Informed Consent: I have reviewed the patients History and Physical, chart, labs and discussed the procedure including the risks, benefits and alternatives for the proposed anesthesia with the patient or authorized representative who has indicated his/her understanding and acceptance.     Plan Discussed with: CRNA, Anesthesiologist and Surgeon  Anesthesia Plan Comments:         Anesthesia Quick Evaluation

## 2014-04-25 NOTE — Anesthesia Procedure Notes (Signed)
Procedure Name: LMA Insertion Date/Time: 04/25/2014 12:19 PM Performed by: Baxter Flattery Pre-anesthesia Checklist: Patient identified, Emergency Drugs available, Suction available and Patient being monitored Patient Re-evaluated:Patient Re-evaluated prior to inductionOxygen Delivery Method: Circle System Utilized Preoxygenation: Pre-oxygenation with 100% oxygen Intubation Type: IV induction Ventilation: Mask ventilation without difficulty LMA: LMA inserted LMA Size: 5.0 Number of attempts: 1 Airway Equipment and Method: bite block Placement Confirmation: positive ETCO2 and breath sounds checked- equal and bilateral Tube secured with: Tape Dental Injury: Teeth and Oropharynx as per pre-operative assessment

## 2014-04-26 ENCOUNTER — Encounter (HOSPITAL_BASED_OUTPATIENT_CLINIC_OR_DEPARTMENT_OTHER): Payer: Self-pay | Admitting: Orthopedic Surgery

## 2014-04-26 LAB — POCT HEMOGLOBIN-HEMACUE: HEMOGLOBIN: 15.8 g/dL (ref 13.0–17.0)

## 2014-04-26 NOTE — Op Note (Signed)
NAME:  Adam Huffman, Adam Huffman                ACCOUNT NO.:  [id number]  MEDICAL RECORD NO.:  [id number]  LOCATION:                                 FACILITY:  PHYSICIAN:  Alta Corning, M.D.   DATE OF BIRTH:  [birth year]  DATE OF PROCEDURE:  04/25/2014 DATE OF DISCHARGE:  04/25/2014                              OPERATIVE REPORT   PREOPERATIVE DIAGNOSIS:  Medial meniscal tear.  POSTOPERATIVE DIAGNOSIS: 1. Medial meniscal tear. 2. Medial shelf plica.  PROCEDURE: 1. Partial medial meniscectomy of a complex posterior horn tear with     anterior and posterior fragments and corresponding debridement in     medial compartment. 2. Medial shelf plica excision.  SURGEON:  Alta Corning, M.D.  ASSISTANT:  Gary Fleet, PA.  ANESTHESIA:  General.  BRIEF HISTORY:  [name] is a 53 year old male with a long history of significant complaints of right knee pain.  He had been treated conservatively for prolonged period of time after failure of all conservative care.  He was taken to the operating room for right knee arthroscopy.  Preoperative MRI showed that he had a complex posterior horn medial meniscal tear.  PROCEDURE IN DETAIL:  The patient was taken to the operating room. After adequate anesthesia was obtained with general anesthetic, the patient was placed supine on the operating table.  The right leg was prepped and draped in usual sterile fashion.  Following a routine arthroscopic examination, knee revealed there was an obvious medial shelf plica, which was blocking entrance into the medial compartment. This went around anteriorly just in front of the anterior cruciate. This plica was excised completely back to the rim.  Once this was done, attention was turned to the medial compartment.  There was a large medial meniscal tear with complex components with flaps anteriorly and posteriorly.  We debrided the flaps anteriorly and posteriorly and we contoured down the remaining meniscal rim  to a smooth section.  Once this was completed, the attention was turned to the ACL normal lateral side.  Once this was done, the knee was copiously and thoroughly lavaged and suction dried.  The arthroscopic portals were closed with bandage.  Sterile compressive dressing was applied.  The patient was taken to the recovery room and was noted to be in satisfactory condition.  Estimated blood loss for the procedure was none.     Alta Corning, M.D.     Corliss Skains  D:  04/25/2014  T:  04/25/2014  Job:  [id number]

## 2014-05-15 ENCOUNTER — Telehealth: Payer: Self-pay | Admitting: *Deleted

## 2014-05-15 NOTE — Telephone Encounter (Signed)
Left msg on triage wanting to get medical records from Dr. Tamala Julian. Called pt back inform him he will have to contact the medical records dept for records gave him medical records #...Adam Huffman

## 2014-05-16 ENCOUNTER — Telehealth: Payer: Self-pay | Admitting: Family Medicine

## 2014-05-16 NOTE — Telephone Encounter (Signed)
Left detailed msg on pt's vmail.  

## 2014-05-16 NOTE — Telephone Encounter (Signed)
Not me.  It was Dr. Berenice Primas office.  Please call him.

## 2014-05-16 NOTE — Telephone Encounter (Signed)
Patient is requesting script for oxycodone.  States Dr. Tamala Julian had written him a script but he did not fill the script and is requesting another script.  Patient would also like to know if Dr. Tamala Julian could draw fluid off his knee.

## 2014-05-24 ENCOUNTER — Ambulatory Visit: Payer: Managed Care, Other (non HMO) | Admitting: Internal Medicine

## 2014-06-15 ENCOUNTER — Other Ambulatory Visit: Payer: Self-pay

## 2014-11-12 ENCOUNTER — Encounter: Payer: Self-pay | Admitting: Internal Medicine

## 2014-11-12 ENCOUNTER — Ambulatory Visit (INDEPENDENT_AMBULATORY_CARE_PROVIDER_SITE_OTHER): Payer: Managed Care, Other (non HMO) | Admitting: Internal Medicine

## 2014-11-12 VITALS — BP 140/90 | HR 70 | Temp 98.4°F | Ht 71.0 in | Wt 184.0 lb

## 2014-11-12 DIAGNOSIS — R0683 Snoring: Secondary | ICD-10-CM

## 2014-11-12 DIAGNOSIS — G4730 Sleep apnea, unspecified: Secondary | ICD-10-CM

## 2014-11-12 NOTE — Progress Notes (Signed)
   Subjective:    Patient ID: Adam Huffman, male    DOB: 02-Aug-1961, 54 y.o.   MRN: [id number]  HPI  His wife states that he has had severe snoring and apneic spells 1-2 times per night for year. He has no cardiopulmonary symptoms.  There are also inquiring about colonoscopy as he is 81. GI review systems is negative.  PMH positive STOP BANG preop 03/2014.  His brother does have sleep apnea.    Review of Systems He has no excessive daytime somnolence. He has had no motor vehicle accidents.  Chest pain, palpitations, tachycardia, exertional dyspnea, paroxysmal nocturnal dyspnea, claudication or edema are absent.  Unexplained weight loss, abdominal pain, significant dyspepsia, dysphagia, melena, rectal bleeding, or persistently small caliber stools are denied.     Objective:   Physical Exam  General appearance:Adequately nourished; no acute distress or increased work of breathing is present.  No  lymphadenopathy about the head, neck, or axilla noted.   Eyes: No conjunctival inflammation or lid edema is present. There is no scleral icterus.  Ears:  External ear exam shows no significant lesions or deformities. He has wax in both canals, greater on the left than the right.   Nose:  External nasal examination shows no deformity or inflammation. Nasal mucosa are pink and moist without lesions or exudates. No septal dislocation or deviation.No obstruction to airflow.   Oral exam: Dental hygiene is good; lips and gums are healthy appearing.There is no oropharyngeal erythema or exudate noted.   Neck:  No deformities, thyromegaly, masses, or tenderness noted.   Supple with full range of motion without pain.   Heart:  Normal rate and regular rhythm. S1 and S2 normal without gallop, murmur, click, rub or other extra sounds.   Lungs:Chest clear to auscultation; no wheezes, rhonchi,rales ,or rubs present.  Abdomen: bowel sounds normal, soft and non-tender without masses, organomegaly or  hernias noted.  No guarding or rebound.Well-healed midabdominal operative scar  Extremities:  No cyanosis, edema, or clubbing  noted    Skin: Warm & dry w/o jaundice or tenting.       Assessment & Plan:  #1 excessive snoring and apnea  #2 screening colonoscopy indicated because of age  Once severity and risk associated with any sleep apnea can be determined; colonoscopy will be pursued.

## 2014-11-12 NOTE — Patient Instructions (Signed)
The Sleep Medicine referral will be scheduled and you'll be notified of the time.Please call the Referral Co-Ordinator @ [phone] if you have not been notified of appointment time within 7-10 days. As per the Standard of Care , screening Colonoscopy recommended @ 50 & every 5-10 years thereafter . More frequent monitor would be dictated by family history or findings @ Colonoscopy. Please call to schedule a screening colonoscopy once the severity and risk related to any sleep apnea can be assessed.

## 2014-11-12 NOTE — Progress Notes (Signed)
Pre visit review using our clinic review tool, if applicable. No additional management support is needed unless otherwise documented below in the visit note. 

## 2014-12-31 ENCOUNTER — Ambulatory Visit (INDEPENDENT_AMBULATORY_CARE_PROVIDER_SITE_OTHER): Payer: 59 | Admitting: Pulmonary Disease

## 2014-12-31 ENCOUNTER — Encounter: Payer: Self-pay | Admitting: Pulmonary Disease

## 2014-12-31 VITALS — BP 105/64 | HR 64 | Ht 70.5 in | Wt 183.0 lb

## 2014-12-31 DIAGNOSIS — G4710 Hypersomnia, unspecified: Secondary | ICD-10-CM | POA: Diagnosis not present

## 2014-12-31 DIAGNOSIS — G4730 Sleep apnea, unspecified: Secondary | ICD-10-CM

## 2014-12-31 NOTE — Patient Instructions (Signed)
Home sleep study 

## 2014-12-31 NOTE — Progress Notes (Signed)
Subjective:    Patient ID: Adam Huffman, male    DOB: 1961/01/19, 54 y.o.   MRN: [id number]  HPI  54 year old cancer survivor presents for evaluation of sleep-disordered breathing. He underwent gastrectomy in 1981, then lysis of additions in '82, resection of [place] tumor in 1985, liver resection for cancer in 2009. He reports non-refreshing sleep, his wife who accompanies him reports loud snoring and witnessed apneas through the night. He has been the recipient of several elbow jabs through the night. Epworth sleepiness score is 15. Bedtime is around 11 PM, sleep latency about 15 minutes, he sleeps on his side and snores more on his back, reports 2-3 nocturnal awakenings including nocturia and is out of bed by 5:45 AM feeling tired without dryness of mouth or headaches. His weight has remained within 5 pounds over the last few years. He denies excessive caffeinated beverage use. There is no history suggestive of cataplexy, sleep paralysis or parasomnias He does not have any problems driving. He is due for a colonoscopy but would like to have this issue sorted out before proceeding  Past Medical History  Diagnosis Date  . Anemia, iron deficiency 06/30/2012  . Pancreas cancer 2009    Dr Doyle Askew  . Liver cancer 2009   Past Surgical History  Procedure Laterality Date  . Pancreas surgery  2009    [facility]  . Liver surgery  2009    with [facility]  . Abdominal adhesion surgery  1984    TOTAL Gastrectomy-  . Total gastrectomy  1981  . Colonoscopy    . Upper gi endoscopy    . Knee arthroscopy Right 04/25/2014    Procedure: ARTHROSCOPY RIGHT KNEE, MEDIAL MENISECTOMY, MEDIAL PLICA;  Surgeon: Alta Corning, MD;  Location: [facility];  Service: Orthopedics;  Laterality: Right;   No Known Allergies  History   Social History  . Marital Status: Married    Spouse Name: N/A  . Number of Children: 3  . Years of Education: N/A    Occupational History  . Not on file.   Social History Main Topics  . Smoking status: Never Smoker   . Smokeless tobacco: Not on file  . Alcohol Use: No  . Drug Use: No  . Sexual Activity: No   Other Topics Concern  . Not on file   Social History Narrative     Family History  Problem Relation Age of Onset  . Heart disease Mother     CM  . Diabetes Brother   . Heart disease Maternal Aunt     several  . Heart disease Maternal Uncle     several  . Stroke Maternal Uncle   . Heart failure Brother   . Heart failure Sister      Review of Systems  Constitutional: Negative for fever, chills, activity change, appetite change and unexpected weight change.  HENT: Negative for congestion, dental problem, postnasal drip, rhinorrhea, sneezing, sore throat, trouble swallowing and voice change.   Eyes: Negative for visual disturbance.  Respiratory: Negative for cough, choking and shortness of breath.   Cardiovascular: Negative for chest pain and leg swelling.  Gastrointestinal: Negative for nausea, vomiting and abdominal pain.  Genitourinary: Negative for difficulty urinating.  Musculoskeletal: Negative for arthralgias.  Skin: Negative for rash.  Psychiatric/Behavioral: Negative for behavioral problems and confusion.       Objective:   Physical Exam  Gen. Pleasant, well-nourished, in no distress, normal affect ENT - no lesions, no post  nasal drip Neck: No JVD, no thyromegaly, no carotid bruits Lungs: no use of accessory muscles, no dullness to percussion, clear without rales or rhonchi  Cardiovascular: Rhythm regular, heart sounds  normal, no murmurs or gallops, no peripheral edema Abdomen: soft and non-tender, no hepatosplenomegaly, BS normal. Musculoskeletal: No deformities, no cyanosis or clubbing Neuro:  alert, non focal       Assessment & Plan:

## 2014-12-31 NOTE — Assessment & Plan Note (Signed)
Given excessive daytime somnolence, narrow pharyngeal exam, witnessed apneas & loud snoring, obstructive sleep apnea is very likely & an overnight polysomnogram will be scheduled as a home study. The pathophysiology of obstructive sleep apnea , it's cardiovascular consequences & modes of treatment including CPAP were discused with the patient in detail & they evidenced understanding.  

## 2015-01-24 DIAGNOSIS — G4730 Sleep apnea, unspecified: Secondary | ICD-10-CM | POA: Diagnosis not present

## 2015-01-30 ENCOUNTER — Other Ambulatory Visit: Payer: Self-pay | Admitting: *Deleted

## 2015-01-30 ENCOUNTER — Telehealth: Payer: Self-pay | Admitting: Pulmonary Disease

## 2015-01-30 DIAGNOSIS — G4710 Hypersomnia, unspecified: Secondary | ICD-10-CM

## 2015-01-30 DIAGNOSIS — G4733 Obstructive sleep apnea (adult) (pediatric): Secondary | ICD-10-CM

## 2015-01-30 DIAGNOSIS — G4730 Sleep apnea, unspecified: Principal | ICD-10-CM

## 2015-01-30 NOTE — Telephone Encounter (Signed)
Mod OSA- stopped breathing 16/h Needs CPAP therapy - OK tos tart autoCPAP if willing 5-12 cm, mask of choice, download in 4 wks, FU in 6 wks

## 2015-01-30 NOTE — Telephone Encounter (Signed)
lmtcb

## 2015-01-31 NOTE — Telephone Encounter (Signed)
Patient notified.  Ordered CPAP, patient scheduled for follow up appointment in [facility] office. Nothing further needed.

## 2015-03-21 ENCOUNTER — Ambulatory Visit (INDEPENDENT_AMBULATORY_CARE_PROVIDER_SITE_OTHER): Payer: 59 | Admitting: Pulmonary Disease

## 2015-03-21 ENCOUNTER — Encounter: Payer: Self-pay | Admitting: Pulmonary Disease

## 2015-03-21 VITALS — BP 135/83 | HR 74 | Ht 70.5 in | Wt 184.0 lb

## 2015-03-21 DIAGNOSIS — G4710 Hypersomnia, unspecified: Secondary | ICD-10-CM

## 2015-03-21 DIAGNOSIS — G4730 Sleep apnea, unspecified: Secondary | ICD-10-CM | POA: Diagnosis not present

## 2015-03-21 NOTE — Addendum Note (Signed)
Addended by: Mathis Dad on: 03/21/2015 02:50 PM   Modules accepted: Orders

## 2015-03-21 NOTE — Progress Notes (Signed)
   Subjective:    Patient ID: Adam Huffman, male    DOB: 09-Jul-1961, 54 y.o.   MRN: [id number]  HPI  54 year old cancer survivor presents for FU of OSA He underwent gastrectomy in 1981, then lysis of additions in '82, resection of [place] tumor in 1985, liver resection for cancer in 2009. He reports non-refreshing sleep, his wife who accompanies him reports loud snoring and witnessed apneas through the night.  He does not have any problems driving. He is due for a colonoscopy but would like to have this issue sorted out before proceeding  Positive STOP BANG pre op screen 03/2014 [facility] -12/2014 Mod OSA- AHI 16/h Started  autoCPAP  5-12 cm, mask of choice, download 03/2015 >> avg pr 9cm, no residuals, usage 5h, no leak  CPAP really helping - wife happy, no snoring, was able to drive to [place], more alert, no naps  Review of Systems neg for any significant sore throat, dysphagia, itching, sneezing, nasal congestion or excess/ purulent secretions, fever, chills, sweats, unintended wt loss, pleuritic or exertional cp, hempoptysis, orthopnea pnd or change in chronic leg swelling. Also denies presyncope, palpitations, heartburn, abdominal pain, nausea, vomiting, diarrhea or change in bowel or urinary habits, dysuria,hematuria, rash, arthralgias, visual complaints, headache, numbness weakness or ataxia.     Objective:   Physical Exam  Gen. Pleasant, well-nourished, in no distress ENT - no lesions, no post nasal drip Neck: No JVD, no thyromegaly, no carotid bruits Lungs: no use of accessory muscles, no dullness to percussion, clear without rales or rhonchi  Cardiovascular: Rhythm regular, heart sounds  normal, no murmurs or gallops, no peripheral edema Musculoskeletal: No deformities, no cyanosis or clubbing        Assessment & Plan:

## 2015-03-21 NOTE — Assessment & Plan Note (Addendum)
CPAP machine is really helping you Change pr to 9 cm & rechk report in 1 month Much improved on CPAP OK to proceed with colonoscopy  Weight loss encouraged, compliance with goal of at least 4-6 hrs every night is the expectation. Advised against medications with sedative side effects Cautioned against driving when sleepy - understanding that sleepiness will vary on a day to day basis

## 2015-03-21 NOTE — Patient Instructions (Signed)
CPAP machine is really helping you Change pr to 9 cm & rechk report in 1 month

## 2015-03-26 ENCOUNTER — Encounter: Payer: Self-pay | Admitting: Pulmonary Disease

## 2015-06-20 ENCOUNTER — Ambulatory Visit: Payer: 59 | Admitting: Adult Health

## 2015-06-26 ENCOUNTER — Ambulatory Visit: Payer: 59 | Admitting: Internal Medicine

## 2015-06-27 ENCOUNTER — Encounter: Payer: Self-pay | Admitting: Adult Health

## 2015-06-27 ENCOUNTER — Ambulatory Visit (INDEPENDENT_AMBULATORY_CARE_PROVIDER_SITE_OTHER): Payer: 59 | Admitting: Adult Health

## 2015-06-27 VITALS — BP 115/79 | HR 66 | Temp 97.5°F | Ht 70.75 in | Wt 187.0 lb

## 2015-06-27 DIAGNOSIS — G4730 Sleep apnea, unspecified: Secondary | ICD-10-CM | POA: Diagnosis not present

## 2015-06-27 DIAGNOSIS — G4710 Hypersomnia, unspecified: Secondary | ICD-10-CM

## 2015-06-27 NOTE — Progress Notes (Signed)
Reviewed & agree with plan  

## 2015-06-27 NOTE — Patient Instructions (Signed)
Keep up good work  Wear CPAP At bedtime  .  Wear at least 6hr each night .  Do not drive if sleepy.  follow up Dr. Elsworth Soho  In 6 months and As needed

## 2015-06-27 NOTE — Assessment & Plan Note (Signed)
Moderate sleep apnea, well controlled on nocturnal C Pap  Plan  Keep up good work  Wear CPAP At bedtime  .  Wear at least 6hr each night .  Do not drive if sleepy.  follow up Dr. Elsworth Soho  In 6 months and As needed

## 2015-06-27 NOTE — Progress Notes (Signed)
   Subjective:    Patient ID: Adam Huffman, male    DOB: March 06, 1961, 54 y.o.   MRN: [id number]  HPI  54 year old  Male with obstructive sleep apnea  He has a history of Zollinger-Ellison tumor in 1995  Status post resection, status post gastrectomy,  Liver resection for cancer in 2009.   Test HST -12/2014 Mod OSA- AHI 16/h Started autoCPAP 5-12 cm, mask of choice, download 03/2015 >> avg pr 9cm, no residuals, usage 5h, no leak  06/27/2015 Follow up : OSA   patient returns for follow-up of his moderate sleep apnea.  Patient says he's been doing very well on his nocturnal C Pap.  he feels rested during the daytime  says that his snoring has resolved which is making his wife are very happy.  He denies any chest pain, orthopnea, PND or significant daytime sleepiness.  Download from July 29 to October 26 shows excellent compliance with average. Uses at 5 hours at a set pressure of 9 cm, AHI 0.9 with minimal leaks Declines flu shot     Review of Systems  Constitutional:   No  weight loss, night sweats,  Fevers, chills, fatigue, or  lassitude.  HEENT:   No headaches,  Difficulty swallowing,  Tooth/dental problems, or  Sore throat,                No sneezing, itching, ear ache, nasal congestion, post nasal drip,   CV:  No chest pain,  Orthopnea, PND, swelling in lower extremities, anasarca, dizziness, palpitations, syncope.   GI  No heartburn, indigestion, abdominal pain, nausea, vomiting, diarrhea, change in bowel habits, loss of appetite, bloody stools.   Resp: No shortness of breath with exertion or at rest.  No excess mucus, no productive cough,  No non-productive cough,  No coughing up of blood.  No change in color of mucus.  No wheezing.  No chest Gift deformity  Skin: no rash or lesions.  GU: no dysuria, change in color of urine, no urgency or frequency.  No flank pain, no hematuria   MS:  No joint pain or swelling.  No decreased range of motion.  No back pain.  Psych:  No  change in mood or affect. No depression or anxiety.  No memory loss.          Objective:   Physical Exam  GEN: A/Ox3; pleasant , NAD, well nourished   HEENT:  [place]/AT,  EACs-clear, TMs-wnl, NOSE-clear, THROAT-clear, no lesions, no postnasal drip or exudate noted.   NECK:  Supple w/ fair ROM; no JVD; normal carotid impulses w/o bruits; no thyromegaly or nodules palpated; no lymphadenopathy.  RESP  Clear  P & A; w/o, wheezes/ rales/ or rhonchi.no accessory muscle use, no dullness to percussion  CARD:  RRR, no m/r/g  , no peripheral edema, pulses intact, no cyanosis or clubbing.  GI:   Soft & nt; nml bowel sounds; no organomegaly or masses detected.  Musco: Warm bil, no deformities or joint swelling noted.   Neuro: alert, no focal deficits noted.    Skin: Warm, no lesions or rashes        Assessment & Plan:

## 2015-07-11 ENCOUNTER — Encounter: Payer: Self-pay | Admitting: Pulmonary Disease

## 2016-03-26 ENCOUNTER — Ambulatory Visit: Payer: 59 | Admitting: Pulmonary Disease

## 2017-10-16 ENCOUNTER — Other Ambulatory Visit: Payer: Self-pay

## 2017-10-16 ENCOUNTER — Emergency Department (HOSPITAL_BASED_OUTPATIENT_CLINIC_OR_DEPARTMENT_OTHER)
Admission: EM | Admit: 2017-10-16 | Discharge: 2017-10-16 | Disposition: A | Discharge status: Home / Self Care | Payer: 59 | Attending: Emergency Medicine | Admitting: Emergency Medicine

## 2017-10-16 ENCOUNTER — Emergency Department (HOSPITAL_BASED_OUTPATIENT_CLINIC_OR_DEPARTMENT_OTHER): Payer: 59

## 2017-10-16 ENCOUNTER — Encounter (HOSPITAL_BASED_OUTPATIENT_CLINIC_OR_DEPARTMENT_OTHER): Payer: Self-pay | Admitting: Emergency Medicine

## 2017-10-16 DIAGNOSIS — S27329A Contusion of lung, unspecified, initial encounter: Secondary | ICD-10-CM

## 2017-10-16 DIAGNOSIS — Z8507 Personal history of malignant neoplasm of pancreas: Secondary | ICD-10-CM | POA: Diagnosis not present

## 2017-10-16 DIAGNOSIS — Z8505 Personal history of malignant neoplasm of liver: Secondary | ICD-10-CM | POA: Diagnosis not present

## 2017-10-16 DIAGNOSIS — Y9389 Activity, other specified: Secondary | ICD-10-CM | POA: Diagnosis not present

## 2017-10-16 DIAGNOSIS — M7918 Myalgia, other site: Secondary | ICD-10-CM | POA: Diagnosis not present

## 2017-10-16 DIAGNOSIS — Y92410 Unspecified street and highway as the place of occurrence of the external cause: Secondary | ICD-10-CM | POA: Insufficient documentation

## 2017-10-16 DIAGNOSIS — Y999 Unspecified external cause status: Secondary | ICD-10-CM | POA: Insufficient documentation

## 2017-10-16 DIAGNOSIS — S299XXA Unspecified injury of thorax, initial encounter: Secondary | ICD-10-CM | POA: Diagnosis present

## 2017-10-16 DIAGNOSIS — R55 Syncope and collapse: Secondary | ICD-10-CM | POA: Diagnosis not present

## 2017-10-16 LAB — COMPREHENSIVE METABOLIC PANEL
ALT: 39 U/L (ref 17–63)
AST: 34 U/L (ref 15–41)
Albumin: 3.8 g/dL (ref 3.5–5.0)
Alkaline Phosphatase: 51 U/L (ref 38–126)
Anion gap: 10 (ref 5–15)
BUN: 11 mg/dL (ref 6–20)
CHLORIDE: 106 mmol/L (ref 101–111)
CO2: 22 mmol/L (ref 22–32)
CREATININE: 0.82 mg/dL (ref 0.61–1.24)
Calcium: 9 mg/dL (ref 8.9–10.3)
Glucose, Bld: 88 mg/dL (ref 65–99)
Potassium: 4.2 mmol/L (ref 3.5–5.1)
Sodium: 138 mmol/L (ref 135–145)
Total Bilirubin: 0.3 mg/dL (ref 0.3–1.2)
Total Protein: 7.5 g/dL (ref 6.5–8.1)

## 2017-10-16 LAB — CBC WITH DIFFERENTIAL/PLATELET
Basophils Absolute: 0 10*3/uL (ref 0.0–0.1)
Basophils Relative: 0 %
EOS PCT: 1 %
Eosinophils Absolute: 0.1 10*3/uL (ref 0.0–0.7)
HCT: 39 % (ref 39.0–52.0)
Hemoglobin: 13.3 g/dL (ref 13.0–17.0)
LYMPHS PCT: 38 %
Lymphs Abs: 1.8 10*3/uL (ref 0.7–4.0)
MCH: 29 pg (ref 26.0–34.0)
MCHC: 34.1 g/dL (ref 30.0–36.0)
MCV: 85.2 fL (ref 78.0–100.0)
Monocytes Absolute: 0.4 10*3/uL (ref 0.1–1.0)
Monocytes Relative: 9 %
Neutro Abs: 2.5 10*3/uL (ref 1.7–7.7)
Neutrophils Relative %: 52 %
PLATELETS: 293 10*3/uL (ref 150–400)
RBC: 4.58 MIL/uL (ref 4.22–5.81)
RDW: 12.2 % (ref 11.5–15.5)
WBC: 4.8 10*3/uL (ref 4.0–10.5)

## 2017-10-16 MED ORDER — IOPAMIDOL (ISOVUE-300) INJECTION 61%
100.0000 mL | Freq: Once | INTRAVENOUS | Status: AC | PRN
  Administered 2017-10-16: 100 mL via INTRAVENOUS

## 2017-10-16 MED ORDER — NAPROXEN 500 MG PO TABS: 500.0000 mg | ORAL_TABLET | Freq: Two times a day (BID) | ORAL | 0 refills | Status: DC

## 2017-10-16 MED ORDER — ONDANSETRON HCL 4 MG/2ML IJ SOLN
4.0000 mg | Freq: Once | INTRAMUSCULAR | Status: AC
  Administered 2017-10-16: 4 mg via INTRAVENOUS
  Filled 2017-10-16: qty 2

## 2017-10-16 MED ORDER — MORPHINE SULFATE (PF) 4 MG/ML IV SOLN
4.0000 mg | Freq: Once | INTRAVENOUS | Status: AC
  Administered 2017-10-16: 4 mg via INTRAVENOUS
  Filled 2017-10-16: qty 1

## 2017-10-16 MED ORDER — KETOROLAC TROMETHAMINE 15 MG/ML IJ SOLN
15.0000 mg | Freq: Once | INTRAMUSCULAR | Status: AC
  Administered 2017-10-16: 15 mg via INTRAVENOUS
  Filled 2017-10-16: qty 1

## 2017-10-16 MED ORDER — METHOCARBAMOL 500 MG PO TABS: 500.0000 mg | ORAL_TABLET | Freq: Two times a day (BID) | ORAL | 0 refills | Status: DC
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
## 2017-10-16 NOTE — ED Notes (Signed)
Patient transported to CT 
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
## 2020-03-23 ENCOUNTER — Encounter (HOSPITAL_BASED_OUTPATIENT_CLINIC_OR_DEPARTMENT_OTHER): Payer: Self-pay | Admitting: Emergency Medicine

## 2020-03-23 ENCOUNTER — Other Ambulatory Visit: Payer: Self-pay

## 2020-03-23 ENCOUNTER — Emergency Department (HOSPITAL_BASED_OUTPATIENT_CLINIC_OR_DEPARTMENT_OTHER)
Admission: EM | Admit: 2020-03-23 | Discharge: 2020-03-23 | Disposition: A | Discharge status: Home / Self Care | Payer: BC Managed Care – PPO | Attending: Emergency Medicine | Admitting: Emergency Medicine

## 2020-03-23 DIAGNOSIS — K645 Perianal venous thrombosis: Secondary | ICD-10-CM | POA: Insufficient documentation

## 2020-03-23 DIAGNOSIS — Z8507 Personal history of malignant neoplasm of pancreas: Secondary | ICD-10-CM | POA: Diagnosis not present

## 2020-03-23 DIAGNOSIS — Z8505 Personal history of malignant neoplasm of liver: Secondary | ICD-10-CM | POA: Diagnosis not present

## 2020-03-23 MED ORDER — LIDOCAINE-EPINEPHRINE 1 %-1:100000 IJ SOLN
20.0000 mL | Freq: Once | INTRAMUSCULAR | Status: DC
  Filled 2020-03-23: qty 1

## 2020-03-23 MED ORDER — LIDOCAINE-EPINEPHRINE (PF) 2 %-1:200000 IJ SOLN
INTRAMUSCULAR | Status: AC
  Filled 2020-03-23: qty 10

## 2020-03-23 NOTE — ED Provider Notes (Signed)
[facility] Provider Note: Georgena Spurling, MD, FACEP  CSN: [id number] MRN: [id number] ARRIVAL: 03/23/20 at [place]: [place]  Abscess   HISTORY OF PRESENT ILLNESS  03/23/20 3:39 AM Adam Huffman is a 59 y.o. male with a 1 day history of what he believes is an abscess at his anal verge.  He has a perianal mass at 9:00 in the lithotomy position it is painful, sharp in nature, worse with movement or bowel movements.  He rates his pain as an 8 out of 10.  It has not been bleeding or draining.   Past Medical History:  Diagnosis Date  . Anemia, iron deficiency 06/30/2012  . Liver cancer ([place]) 2009  . 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 ABDOMINAL ADHESION SURGERY  1984   TOTAL Gastrectomy-  . COLONOSCOPY    . KNEE ARTHROSCOPY Right 04/25/2014   Procedure: ARTHROSCOPY RIGHT KNEE, MEDIAL MENISECTOMY, MEDIAL PLICA;  Surgeon: Alta Corning, MD;  Location: [place];  Service: Orthopedics;  Laterality: Right;  . LIVER SURGERY  2009   with cholecystectomy-lower lobe removed-[facility]  . PANCREAS SURGERY  2009   [facility]  . TOTAL GASTRECTOMY  1981  . UPPER GI ENDOSCOPY      Family History  Problem Relation Age of Onset  . Heart disease Mother        CM  . Diabetes Brother   . Heart disease Maternal Aunt        several  . Heart disease Maternal Uncle        several  . Stroke Maternal Uncle   . Heart failure Brother   . Heart failure Sister     Social History   Tobacco Use  . Smoking status: Never Smoker  . Smokeless tobacco: Never Used  Substance Use Topics  . Alcohol use: No    Alcohol/week: 0.0 standard drinks  . Drug use: No    Prior to Admission medications   Medication Sig Start Date End Date Taking? Authorizing Provider  Multiple Vitamins-Minerals (CENTRUM PO) Take 1 tablet by mouth daily.     Yes [provider]    Allergies Patient has no known  allergies.   REVIEW OF SYSTEMS  Negative except as noted here or in the History of Present Illness.   PHYSICAL EXAMINATION  Initial Vital Signs Blood pressure (!) 137/89, pulse 59, temperature 98.2 F (36.8 C), resp. rate 18, weight 85.3 kg, SpO2 100 %.  Examination General: Well-developed, well-nourished male in no acute distress; appearance consistent with age of record HENT: normocephalic; atraumatic Eyes: Normal appearance Neck: supple Heart: regular rate and rhythm Lungs: clear to auscultation bilaterally Abdomen: soft; nondistended; nontender; bowel sounds present Rectal: Thrombosed hemorrhoid at 9:00 in the lithotomy position Extremities: No deformity; full range of motion; pulses normal Neurologic: Awake, alert and oriented; motor function intact in all extremities and symmetric; no facial droop Skin: Warm and dry Psychiatric: Normal mood and affect   RESULTS  Summary of this visit's results, reviewed and interpreted by myself:   EKG Interpretation  Date/Time:    Ventricular Rate:    PR Interval:    QRS Duration:   QT Interval:    QTC Calculation:   R Axis:     Text Interpretation:        Laboratory Studies: No results found for this or any previous visit (from the past 24 hour(s)).  Imaging Studies: No results found.  ED COURSE and MDM  Nursing notes, initial and subsequent vitals signs, including pulse oximetry, reviewed and interpreted by myself.  Vitals:   03/23/20 0121  BP: (!) 137/89  Pulse: 59  Resp: 18  Temp: 98.2 F (36.8 C)  SpO2: 100%  Weight: 85.3 kg   Medications  lidocaine-EPINEPHrine (XYLOCAINE W/EPI) 1 %-1:100000 (with pres) injection 20 mL (has no administration in time range)  lidocaine-EPINEPHrine (XYLOCAINE W/EPI) 2 %-1:200000 (PF) injection (has no administration in time range)      PROCEDURES  .Marland KitchenIncision and Drainage  Date/Time: 03/23/2020 3:53 AM Performed by: Almeda Ezra, MD Authorized by: Almetta Liddicoat, MD    Consent:    Consent obtained:  Verbal   Consent given by:  Patient   Risks discussed:  Bleeding   Alternatives discussed:  No treatment Location:    Indications for incision and drainage: Thrombosed hemorrhoid.   Size:  1.5 cm   Location: Anus. Anesthesia (see MAR for exact dosages):    Anesthesia method:  Local infiltration   Local anesthetic:  Lidocaine 2% WITH epi Procedure type:    Complexity:  Complex Procedure details:    Incision type: Elliptical excision of tissue radial to the anus.   Incision depth:  Submucosal   Scalpel size: Scissors.   Techniques: Evacuation of clots.   Wound treatment:  Wound left open   Packing materials:  None Post-procedure details:    Patient tolerance of procedure:  Tolerated well, no immediate complications   ED DIAGNOSES     ICD-10-CM   1. Thrombosed external hemorrhoid  K64.5        Mahala Rommel, Jenny Reichmann, MD 03/23/20 [phone]

## 2020-03-23 NOTE — ED Triage Notes (Signed)
Reports boil or abscess to buttock since yesterday. Denies drainage.
